# Patient Record
Sex: Female | Born: 1965 | Race: White | Hispanic: No | Marital: Married | Smoking: Current every day smoker
Health system: Southern US, Community
[De-identification: ages and names within clinical notes are randomized; demographics above are authoritative.]

## PROBLEM LIST (undated history)

## (undated) DIAGNOSIS — K219 Gastro-esophageal reflux disease without esophagitis: Secondary | ICD-10-CM

## (undated) DIAGNOSIS — Z8719 Personal history of other diseases of the digestive system: Secondary | ICD-10-CM

## (undated) DIAGNOSIS — E039 Hypothyroidism, unspecified: Secondary | ICD-10-CM

## (undated) DIAGNOSIS — I10 Essential (primary) hypertension: Secondary | ICD-10-CM

## (undated) DIAGNOSIS — I219 Acute myocardial infarction, unspecified: Secondary | ICD-10-CM

## (undated) DIAGNOSIS — Z87442 Personal history of urinary calculi: Secondary | ICD-10-CM

## (undated) DIAGNOSIS — E7800 Pure hypercholesterolemia, unspecified: Secondary | ICD-10-CM

## (undated) DIAGNOSIS — G709 Myoneural disorder, unspecified: Secondary | ICD-10-CM

## (undated) HISTORY — PX: CHOLECYSTECTOMY: SHX55

## (undated) HISTORY — PX: COLONOSCOPY WITH ESOPHAGOGASTRODUODENOSCOPY (EGD): SHX5779

## (undated) HISTORY — PX: OTHER SURGICAL HISTORY: SHX169

---

## 1999-10-02 ENCOUNTER — Emergency Department (HOSPITAL_COMMUNITY): Admission: EM | Admit: 1999-10-02 | Discharge: 1999-10-02 | Payer: Self-pay | Admitting: *Deleted

## 2000-10-30 ENCOUNTER — Emergency Department (HOSPITAL_COMMUNITY): Admission: EM | Admit: 2000-10-30 | Discharge: 2000-10-30 | Payer: Self-pay | Admitting: Emergency Medicine

## 2000-10-30 ENCOUNTER — Encounter: Payer: Self-pay | Admitting: Emergency Medicine

## 2002-03-11 ENCOUNTER — Emergency Department (HOSPITAL_COMMUNITY): Admission: EM | Admit: 2002-03-11 | Discharge: 2002-03-12 | Payer: Self-pay | Admitting: Emergency Medicine

## 2002-03-14 ENCOUNTER — Encounter: Payer: Self-pay | Admitting: General Surgery

## 2002-03-14 ENCOUNTER — Encounter: Admission: RE | Admit: 2002-03-14 | Discharge: 2002-03-14 | Payer: Self-pay | Admitting: General Surgery

## 2002-06-21 ENCOUNTER — Ambulatory Visit (HOSPITAL_BASED_OUTPATIENT_CLINIC_OR_DEPARTMENT_OTHER): Admission: RE | Admit: 2002-06-21 | Discharge: 2002-06-21 | Payer: Self-pay | Admitting: Neurology

## 2002-11-27 ENCOUNTER — Encounter: Payer: Self-pay | Admitting: *Deleted

## 2002-11-27 ENCOUNTER — Inpatient Hospital Stay (HOSPITAL_COMMUNITY): Admission: EM | Admit: 2002-11-27 | Discharge: 2002-11-29 | Payer: Self-pay | Admitting: *Deleted

## 2004-05-09 ENCOUNTER — Ambulatory Visit (HOSPITAL_COMMUNITY): Admission: RE | Admit: 2004-05-09 | Discharge: 2004-05-09 | Payer: Self-pay

## 2004-11-14 ENCOUNTER — Emergency Department (HOSPITAL_COMMUNITY): Admission: EM | Admit: 2004-11-14 | Discharge: 2004-11-15 | Payer: Self-pay | Admitting: Emergency Medicine

## 2007-04-20 ENCOUNTER — Emergency Department (HOSPITAL_COMMUNITY): Admission: EM | Admit: 2007-04-20 | Discharge: 2007-04-20 | Payer: Self-pay | Admitting: Emergency Medicine

## 2009-09-19 ENCOUNTER — Emergency Department (HOSPITAL_COMMUNITY): Admission: EM | Admit: 2009-09-19 | Discharge: 2009-09-20 | Payer: Self-pay | Admitting: Emergency Medicine

## 2010-01-23 ENCOUNTER — Emergency Department (HOSPITAL_COMMUNITY): Admission: EM | Admit: 2010-01-23 | Discharge: 2010-01-23 | Payer: Self-pay | Admitting: Emergency Medicine

## 2010-05-26 LAB — CBC
HCT: 36.1 % (ref 36.0–46.0)
MCH: 32.2 pg (ref 26.0–34.0)
MCHC: 34.1 g/dL (ref 30.0–36.0)
MCV: 94.5 fL (ref 78.0–100.0)
Platelets: 255 10*3/uL (ref 150–400)
RDW: 13.7 % (ref 11.5–15.5)
WBC: 12.9 10*3/uL — ABNORMAL HIGH (ref 4.0–10.5)

## 2010-05-26 LAB — DIFFERENTIAL
Basophils Relative: 1 % (ref 0–1)
Lymphocytes Relative: 30 % (ref 12–46)
Lymphs Abs: 3.9 10*3/uL (ref 0.7–4.0)
Monocytes Absolute: 0.5 10*3/uL (ref 0.1–1.0)
Monocytes Relative: 4 % (ref 3–12)
Neutro Abs: 8.1 10*3/uL — ABNORMAL HIGH (ref 1.7–7.7)
Neutrophils Relative %: 63 % (ref 43–77)

## 2010-05-26 LAB — POCT CARDIAC MARKERS
Myoglobin, poc: 46 ng/mL (ref 12–200)
Troponin i, poc: <0.05 ng/mL (ref 0.00–0.09)

## 2010-05-26 LAB — URINE MICROSCOPIC-ADD ON

## 2010-05-26 LAB — URINALYSIS, ROUTINE W REFLEX MICROSCOPIC
Glucose, UA: >1000 mg/dL — AB
Leukocytes, UA: NEGATIVE
Protein, ur: 30 mg/dL — AB
Specific Gravity, Urine: >1.03 — ABNORMAL HIGH (ref 1.005–1.030)

## 2010-05-26 LAB — COMPREHENSIVE METABOLIC PANEL
Albumin: 3.4 g/dL — ABNORMAL LOW (ref 3.5–5.2)
Alkaline Phosphatase: 69 U/L (ref 39–117)
BUN: 8 mg/dL (ref 6–23)
Calcium: 8.3 mg/dL — ABNORMAL LOW (ref 8.4–10.5)
Creatinine, Ser: 0.53 mg/dL (ref 0.4–1.2)
Glucose, Bld: 194 mg/dL — ABNORMAL HIGH (ref 70–99)
Total Protein: 6.8 g/dL (ref 6.0–8.3)

## 2010-07-26 NOTE — Consult Note (Signed)
NAMELOREAL, SCHUESSLER                          ACCOUNT NO.:  [id number]   MEDICAL RECORD NO.:  [id number]                   PATIENT TYPE:  INP   LOCATION:  A220                                 FACILITY:  [facility]   PHYSICIAN:  Maisie Fus C. Wall, M.D.                DATE OF BIRTH:  12/29/1965   DATE OF CONSULTATION:  11/28/2002  DATE OF DISCHARGE:                                   CONSULTATION   REASON FOR CONSULTATION:  We were asked by Dr. Josefine Class to evaluate [name], a 45 year old married white female with chest discomfort quite  worrisome for ischemia.   Yesterday while at rest she developed substernal chest pressure and  tightness going up into her left neck and down into her left arm.  She broke  out in a sweat and became nauseated.  Her EKGs have been normal here as have  her enzymes.  Unfortunately, she has multiple risk factors including heavy  tobacco use, family history with a father having MI at age 51, obesity,  unknown lipid status.  She has had no recurrent pain since admission.   PAST MEDICAL HISTORY:  1. She has a history of migraines.  2. She has had three cesarean sections - 1983, 1986, 1990.  3. She has a history of hypothyroidism on replacement therapy.  4. She has had a cholecystectomy in 1987.   MEDICATIONS PRIOR TO ADMISSION:  Synthroid 88 mcg daily.   ALLERGIES:  No known drug allergies.   SOCIAL HISTORY:  She lives with her husband.  She has a 23 pack-year smoking  history.  She does not drink.  She does not do any drugs.   REVIEW OF SYSTEMS:  Unremarkable.   PHYSICAL EXAMINATION:  VITAL SIGNS:  Blood pressure today is 111/63, pulse  56 and regular, respiratory rate 20, temperature is 97.4, O2 saturation 98%  on room air.  She weighs 213.3.  GENERAL:  She is in no acute distress.  Skin is warm and dry.  HEENT:  Unremarkable.  Carotid upstrokes are equal bilaterally without  bruits.  There is no JVD.  Thyroid is not enlarged.  HEART:  Reveals a  regular rate and rhythm with a soft systolic murmur along  the left sternal border.  LUNGS:  Clear.  SKIN:  Unremarkable.  ABDOMEN:  Soft, good bowel sounds.  She is obese making organomegaly  difficult to assess.  EXTREMITIES:  Reveal good pulses both dorsalis pedis and posterior tibial.  There is no edema.  NEUROLOGIC:  Intact.   LABORATORY DATA:  Chest x-ray is pending.   EKG shows sinus rhythm with a rate of 66.  She has some nonspecific ST  segment changes.   Cardiac enzymes x3 were negative.  Troponin is borderline high-normal.   ASSESSMENT AND PLAN:  1. Chest discomfort quite consistent with angina.  She has multiple cardiac     risk factors.  Although she is a young age, I feel reluctant to stress     her with this presentation.  Will plan on referring her for a left heart     catheterization and coronary angiogram.  She understands the indications,     risks, potential benefits, and agrees to proceed.  Will transfer her     tomorrow when bed and catheterization laboratory schedule are available.  2. Hypothyroidism on replacement therapy.  3. Tobacco use.  We have encouraged her to stop smoking.   Will also check a fasting lipid panel.                                               Thomas C. Wall, M.D.    TCW/MEDQ  D:  11/28/2002  T:  11/28/2002  Job:  [id number]   cc:   [place],  [place] [address] Stacie [place]

## 2010-07-26 NOTE — Discharge Summary (Signed)
NAMECARMELL, [name] NO.:  [id number]   MEDICAL RECORD NO.:  [id number]                   PATIENT TYPE:  INP   LOCATION:  4708                                 FACILITY:  [facility]   PHYSICIAN:  Pricilla Riffle, M.D.                 DATE OF BIRTH:  12/02/1965   DATE OF ADMISSION:  11/28/2002  DATE OF DISCHARGE:  11/29/2002                           DISCHARGE SUMMARY - REFERRING   PROCEDURE:  Cardiac catheterization September 21.   REASON FOR ADMISSION:  Please refer to dictated consultation note by Dr.  Daleen Squibb.   LABORATORY DATA:  (At [facility]) Normal CBC. INR 1.0. Normal electrolytes  and renal function. Glucose 158. Elevated total CPK 230 with normal MB  fraction, troponin I 0.04, 0.05. Lipid profile:  Total cholesterol 164,  triglycerides 305, HDL 25, LDL 78 (cholesterol/HDL ratio 6.6). TSH 0.964.   Chest x-ray [facility]):  Mild pulmonary edema.   HOSPITAL COURSE:  Following initial presentation to [facility] for  evaluation of chest pain, the patient was referred to Dr. Valera Castle for  consultation (see dictated note for full details). The patient presented  with no previous history of coronary artery disease, but symptoms were  worrisome for unstable angina pectoris. Therefore, recommendation was to  arrange transfer to [facility] for diagnostic coronary angiography. Initial  cardiac markers were negative.   Following transfer, the patient was maintained on medication regimen  consisting of Lovenox, beta blocker, and aspirin.   The following day, the patient underwent coronary angiography by Dr. Randa Evens (see report for full details) revealing normal coronary arteries and  a normal left ventricle.   Dr. Samule Ohm recommended aggressive primary prevention including smoking  cessation, weight loss, and management of diabetes mellitus.   MEDICATIONS AT DISCHARGE:  Synthroid 0.088 mg q.d.   DISCHARGE INSTRUCTIONS:  Refrain from  any heavy lifting, strenuous activity,  or driving for two days. Maintain low fat/cholesterol diet. Call the  cardiology office in  [place] [phone]) for any bleeding/swelling of the  groin.   The patient is instructed to arrange followup with her primary care  physician, Dr. Rosana Berger, in two weeks.   The patient will return to Dr. Valera Castle on an as needed basis.   DISCHARGE DIAGNOSES:  1. Noncardiac chest pain. Normal coronary angiogram September 21.  2. Multiple cardiac risk factors.     A. Mixed dyslipidemia.     B. Tobacco.     C.        Obesity.     D. Family history of premature coronary artery disease.  3. Treated hypothyroidism.      Gene Serpe, P.A. [facility]                      Pricilla Riffle, M.D.    GS/MEDQ  D:  11/29/2002  T:  11/30/2002  Job:  [id number]   cc:   Joycelyn Rua, M.D.  [address] [place]  [place] [address]  Fax: [phone]

## 2010-07-26 NOTE — Discharge Summary (Signed)
   NAMEDEANZA, UPPERMAN                          ACCOUNT NO.:  [id number]   MEDICAL RECORD NO.:  [id number]                   PATIENT TYPE:  INP   LOCATION:  4708                                 FACILITY:  [facility]   PHYSICIAN:  Hanley Hays. Dechurch, M.D.           DATE OF BIRTH:  [birth year]   DATE OF ADMISSION:  11/27/2002  DATE OF DISCHARGE:  11/28/2002                                 DISCHARGE SUMMARY   DIAGNOSES:  1. Chest pain.  2. Hypothyroidism.  3. Tobacco abuse.   DISPOSITION:  The patient transferred to [facility] for left heart  catheterization.   HOSPITAL COURSE:  A 45 year old Caucasian female with presentation of chest  pain with diaphoresis and nausea concerning for angina with a history of  tobacco abuse, family history of father having an MI at age 83, with lipid  status unknown at that time, who was pain free after receiving  nitroglycerin.  She was seen in consultation with cardiology, who felt that  her symptoms were worrisome to the point that left heart catheterization was  indicated and arrangements were made for transfer to [facility].  She was discharged on September 20, in stable condition.     ___________________________________________                                         Hanley Hays Josefine Class, M.D.   FED/MEDQ  D:  12/28/2002  T:  12/28/2002  Job:  [id number]

## 2010-07-26 NOTE — Cardiovascular Report (Signed)
   [name], POSTER NO.:  [id number]   MEDICAL RECORD NO.:  [id number]                   PATIENT TYPE:  INP   LOCATION:  4708                                 FACILITY:  [facility]   PHYSICIAN:  Salvadore Farber, M.D.             DATE OF BIRTH:  June 27, 1965   DATE OF PROCEDURE:  11/29/2002  DATE OF DISCHARGE:                              CARDIAC CATHETERIZATION   PROCEDURE:  Left heart catheterization, left ventriculography, coronary  angiography.   INDICATIONS:  Ms. Dehaan is a 45 year old lady with ongoing tobacco abuse  who presents with chest discomfort.  Though there is no documented history  of diabetes mellitus, she has an elevated glucose on admission.  She also  has a family history remarkable for myocardial infarction in her father at  35 years.  Due to these risk factors, she was referred directly for  diagnostic angiography.   PROCEDURAL TECHNIQUE:  Informed consent was obtained.  Under 1% lidocaine  local anesthesia a 6-French sheath was placed in the right femoral artery  using the modified Seldinger technique.  Diagnostic angiography and  ventriculography were performed using JL4, JR4, and pigtail catheters.  The  patient tolerated the procedure well.  Sheath was removed on the table.  She  was transferred to the holding room in stable condition.   COMPLICATIONS:  None.   FINDINGS:  1. Left main:  Angiographically normal.  2. LAD:  Moderate sized vessel giving rise to a single diagonal branch.  It     is angiographically normal.  3. Circumflex:  Moderate sized vessel giving rise to two obtuse marginals.     It is angiographically normal.  4. RCA:  Moderate sized, dominant vessel which is angiographically normal.  5. LV 157/15/23.  EF 60% without regional wall motion abnormality.  6. No aortic stenosis.  7. Unable to assess mitral regurgitation due to ventricular ectopy during     ventriculography.   IMPRESSION/RECOMMENDATIONS:  1. Angiographically normal coronary arteries.  2. Normal left ventricular size and systolic function.  3. No aortic stenosis.   The patient has normal cardiac catheterization.  Suspect noncardiac etiology  to her chest discomfort.  Nonetheless, primary prevention will be critical  for prevention of development of disease in this young lady.  Smoking  cessation, weight loss, and potentially therapy for her newly diagnosed  diabetes will be important.                                               Salvadore Farber, M.D.    WED/MEDQ  D:  11/29/2002  T:  11/29/2002  Job:  [id number]   cc:   Burnadette Pop, M.D.   Thomas C. Wall, M.D.

## 2011-03-11 HISTORY — PX: ESOPHAGOGASTRODUODENOSCOPY: SHX1529

## 2011-03-11 HISTORY — PX: COLONOSCOPY: SHX174

## 2011-03-14 ENCOUNTER — Encounter: Payer: Self-pay | Admitting: Emergency Medicine

## 2011-03-14 DIAGNOSIS — F17200 Nicotine dependence, unspecified, uncomplicated: Secondary | ICD-10-CM | POA: Insufficient documentation

## 2011-03-14 DIAGNOSIS — R141 Gas pain: Secondary | ICD-10-CM | POA: Insufficient documentation

## 2011-03-14 DIAGNOSIS — R10816 Epigastric abdominal tenderness: Secondary | ICD-10-CM | POA: Insufficient documentation

## 2011-03-14 DIAGNOSIS — R05 Cough: Secondary | ICD-10-CM | POA: Insufficient documentation

## 2011-03-14 DIAGNOSIS — E119 Type 2 diabetes mellitus without complications: Secondary | ICD-10-CM | POA: Insufficient documentation

## 2011-03-14 DIAGNOSIS — I10 Essential (primary) hypertension: Secondary | ICD-10-CM | POA: Insufficient documentation

## 2011-03-14 DIAGNOSIS — R142 Eructation: Secondary | ICD-10-CM | POA: Insufficient documentation

## 2011-03-14 DIAGNOSIS — R059 Cough, unspecified: Secondary | ICD-10-CM | POA: Insufficient documentation

## 2011-03-14 DIAGNOSIS — R300 Dysuria: Secondary | ICD-10-CM | POA: Insufficient documentation

## 2011-03-14 DIAGNOSIS — R1012 Left upper quadrant pain: Secondary | ICD-10-CM | POA: Insufficient documentation

## 2011-03-14 DIAGNOSIS — R143 Flatulence: Secondary | ICD-10-CM | POA: Insufficient documentation

## 2011-03-14 DIAGNOSIS — E7800 Pure hypercholesterolemia, unspecified: Secondary | ICD-10-CM | POA: Insufficient documentation

## 2011-03-14 DIAGNOSIS — J3489 Other specified disorders of nose and nasal sinuses: Secondary | ICD-10-CM | POA: Insufficient documentation

## 2011-03-14 DIAGNOSIS — R10812 Left upper quadrant abdominal tenderness: Secondary | ICD-10-CM | POA: Insufficient documentation

## 2011-03-15 ENCOUNTER — Emergency Department (HOSPITAL_COMMUNITY): Payer: BC Managed Care – PPO

## 2011-03-15 ENCOUNTER — Emergency Department (HOSPITAL_COMMUNITY)
Admission: EM | Admit: 2011-03-15 | Discharge: 2011-03-15 | Disposition: A | Discharge status: Home / Self Care | Payer: BC Managed Care – PPO | Attending: Emergency Medicine | Admitting: Emergency Medicine

## 2011-03-15 DIAGNOSIS — R0789 Other chest pain: Secondary | ICD-10-CM

## 2011-03-15 HISTORY — DX: Pure hypercholesterolemia, unspecified: E78.00

## 2011-03-15 HISTORY — DX: Essential (primary) hypertension: I10

## 2011-03-15 LAB — DIFFERENTIAL
Basophils Absolute: 0 10*3/uL (ref 0.0–0.1)
Lymphocytes Relative: 32 % (ref 12–46)
Neutro Abs: 7.2 10*3/uL (ref 1.7–7.7)

## 2011-03-15 LAB — COMPREHENSIVE METABOLIC PANEL
ALT: 14 U/L (ref 0–35)
AST: 12 U/L (ref 0–37)
Alkaline Phosphatase: 61 U/L (ref 39–117)
CO2: 23 mEq/L (ref 19–32)
Chloride: 102 mEq/L (ref 96–112)
GFR calc non Af Amer: 88 mL/min — ABNORMAL LOW (ref >90)
Sodium: 133 mEq/L — ABNORMAL LOW (ref 135–145)
Total Bilirubin: 0.2 mg/dL — ABNORMAL LOW (ref 0.3–1.2)

## 2011-03-15 LAB — URINALYSIS, ROUTINE W REFLEX MICROSCOPIC
Bilirubin Urine: NEGATIVE
Specific Gravity, Urine: >1.03 — ABNORMAL HIGH (ref 1.005–1.030)
Urobilinogen, UA: 0.2 mg/dL (ref 0.0–1.0)

## 2011-03-15 LAB — CBC
HCT: 35.8 % — ABNORMAL LOW (ref 36.0–46.0)
Platelets: 241 10*3/uL (ref 150–400)
RBC: 3.97 MIL/uL (ref 3.87–5.11)
RDW: 14 % (ref 11.5–15.5)
WBC: 11.7 10*3/uL — ABNORMAL HIGH (ref 4.0–10.5)

## 2011-03-15 LAB — URINE MICROSCOPIC-ADD ON

## 2011-03-15 MED ORDER — HYDROCODONE-ACETAMINOPHEN 7.5-500 MG/15ML PO SOLN: 15.0000 mL | Freq: Four times a day (QID) | ORAL | Status: AC | PRN

## 2011-03-15 MED ORDER — SODIUM CHLORIDE 0.9 % IV SOLN
Freq: Once | INTRAVENOUS | Status: AC
  Administered 2011-03-15: 01:00:00 via INTRAVENOUS

## 2011-03-15 MED ORDER — HYDROCODONE-ACETAMINOPHEN 7.5-500 MG/15ML PO SOLN
10.0000 mL | Freq: Once | ORAL | Status: AC
  Administered 2011-03-15: 10 mL via ORAL
  Filled 2011-03-15: qty 15

## 2011-03-15 NOTE — ED Notes (Signed)
Pt alert & oriented x4, stable gait. Pt given discharge instructions, paperwork & prescription(s), pt verbalized understanding. Pt left department w/ no further questions.  

## 2011-03-15 NOTE — Discharge Instructions (Signed)
Chest Wall Pain Chest wall pain is pain in or around the bones and muscles of your chest. This may occur:   On its own (spontaneously).   After a viral illness such as the flu.   Through injur.   From coughing.   Minor exercise.  It may take up to 6 weeks to get better; longer if you must stay physically active in your work and activities. HOME CARE INSTRUCTIONS   Avoid over-tiring physical activity. Try not to strain or perform activities which cause pain. This would include any activities using chest, belly (abdominal) and side muscles, especially if heavy weights are used.   Use ice on the painful area for 15 to 20 minutes per hour while awake for the first 2 days. Place the ice in a plastic bag and place a towel between the bag of ice and your skin.   Only take over-the-counter or prescription medicines for pain, discomfort, or fever as directed by your caregiver.  SEEK IMMEDIATE MEDICAL CARE IF:   Your pain increases or you are very uncomfortable.   An oral temperature above 102 F (38.9 C)develops.   Your chest pains become worse.   You develop new, unexplained problems (symptoms).   You develop nausea, vomiting, sweating or feel light headed.   You develop a cough which produces phlegm (sputum) or you cough up blood.  MAKE SURE YOU:   Understand these instructions.   Will watch your condition.   Will get help right away if you are not doing well or get worse.  Document Released: 02/24/2005 Document Revised: 09/09/2010 Document Reviewed: 10/13/2007 Exit[employer] Patient Information 2012 [employer]. 

## 2011-03-15 NOTE — ED Provider Notes (Signed)
History     CSN: [id number]  Arrival date & time 03/14/11  2342   First MD Initiated Contact with Patient 03/15/11 0008      Chief Complaint  Patient presents with  . Abdominal Pain  . Cough  . Nasal Congestion    (Consider location/radiation/quality/duration/timing/severity/associated sxs/prior treatment) Patient is a 46 y.o. female presenting with abdominal pain and cough. The history is provided by the patient.  Abdominal Pain The primary symptoms of the illness include abdominal pain.  Cough   patient here with 2 weeks of cough congestion symptoms. Tonight however she noticed some left upper quadrant pain as well as abdominal distention. Some associated nausea denies any vomiting or fever. Had normal bowel abdomen today. Has noted some increase in flatus. Denies any diarrhea. Nothing makes her symptoms better or worse. No medications used prior to arrival. She does note some dysuria as well. Denies any colicky symptoms  Past Medical History  Diagnosis Date  . Diabetes mellitus   . Hypertension   . High cholesterol     Past Surgical History  Procedure Date  . Cholecystectomy   . Cesarean section     No family history on file.  History  Substance Use Topics  . Smoking status: Current Everyday Smoker -- 1.0 packs/day    Types: Cigarettes  . Smokeless tobacco: Not on file  . Alcohol Use: Yes     occ    OB History    Grav Para Term Preterm Abortions TAB SAB Ect Mult Living                  Review of Systems  Respiratory: Positive for cough.   Gastrointestinal: Positive for abdominal pain.  All other systems reviewed and are negative.    Allergies  Review of patient's allergies indicates no known allergies.  Home Medications   Current Outpatient Rx  Name Route Sig Dispense Refill  . LISINOPRIL-HYDROCHLOROTHIAZIDE 20-12.5 MG PO TABS Oral Take 2 tablets by mouth daily.      . NORETHINDRONE ACETATE 5 MG PO TABS Oral Take 10 mg by mouth daily.      Marland Kitchen  SAXAGLIPTIN-METFORMIN ER 2.07-998 MG PO TB24 Oral Take 2 tablets by mouth daily with supper.        BP 144/75  Pulse 69  Temp(Src) 98.4 F (36.9 C) (Oral)  Resp 18  Ht 4\' 11"  (1.499 m)  Wt 167 lb (75.751 kg)  BMI 33.73 kg/m2  SpO2 99%  LMP 03/10/2011  Physical Exam  Nursing note and vitals reviewed. Constitutional: She is oriented to person, place, and time. She appears well-developed and well-nourished.  Non-toxic appearance. No distress.  HENT:  Head: Normocephalic and atraumatic.  Eyes: Conjunctivae, EOM and lids are normal. Pupils are equal, round, and reactive to light.  Neck: Normal range of motion. Neck supple. No tracheal deviation present. No mass present.  Cardiovascular: Normal rate, regular rhythm and normal heart sounds.  Exam reveals no gallop.   No murmur heard. Pulmonary/Chest: Effort normal and breath sounds normal. No stridor. No respiratory distress. She has no decreased breath sounds. She has no wheezes. She has no rhonchi. She has no rales.  Abdominal: Soft. Normal appearance and bowel sounds are normal. She exhibits no distension. There is tenderness in the epigastric area and left upper quadrant. There is no rigidity, no rebound, no guarding and no CVA tenderness.  Musculoskeletal: Normal range of motion. She exhibits no edema and no tenderness.  Neurological: She is alert and oriented  to person, place, and time. She has normal strength. No cranial nerve deficit or sensory deficit. GCS eye subscore is 4. GCS verbal subscore is 5. GCS motor subscore is 6.  Skin: Skin is warm and dry. No abrasion and no rash noted.  Psychiatric: She has a normal mood and affect. Her speech is normal and behavior is normal.    ED Course  Procedures (including critical care time)   Labs Reviewed  CBC  DIFFERENTIAL  COMPREHENSIVE METABOLIC PANEL  URINALYSIS, ROUTINE W REFLEX MICROSCOPIC  URINE CULTURE  LIPASE, BLOOD   No results found.   No diagnosis found.    MDM    Patient given medication for cough as well as better at this time. Patient notes pain in her left costal margin which when reexamined his current outpatient. Suspect patient's chest wall pain. She has been coughing for 2 weeks and notes that the pain is worse with movement we'll prescribe medication for this        Toy Baker, MD 03/15/11 [id number]

## 2011-03-16 LAB — URINE CULTURE: Culture: NO GROWTH

## 2011-09-02 ENCOUNTER — Other Ambulatory Visit (HOSPITAL_BASED_OUTPATIENT_CLINIC_OR_DEPARTMENT_OTHER): Payer: Self-pay | Admitting: Family Medicine

## 2011-09-02 DIAGNOSIS — R1012 Left upper quadrant pain: Secondary | ICD-10-CM

## 2011-09-03 ENCOUNTER — Ambulatory Visit (HOSPITAL_BASED_OUTPATIENT_CLINIC_OR_DEPARTMENT_OTHER)
Admission: RE | Admit: 2011-09-03 | Discharge: 2011-09-03 | Disposition: A | Discharge status: Home / Self Care | Payer: BC Managed Care – PPO | Source: Ambulatory Visit | Attending: Family Medicine | Admitting: Family Medicine

## 2011-09-03 ENCOUNTER — Ambulatory Visit (HOSPITAL_BASED_OUTPATIENT_CLINIC_OR_DEPARTMENT_OTHER): Payer: BC Managed Care – PPO

## 2011-09-03 DIAGNOSIS — I700 Atherosclerosis of aorta: Secondary | ICD-10-CM | POA: Insufficient documentation

## 2011-09-03 DIAGNOSIS — I708 Atherosclerosis of other arteries: Secondary | ICD-10-CM | POA: Insufficient documentation

## 2011-09-03 DIAGNOSIS — R109 Unspecified abdominal pain: Secondary | ICD-10-CM | POA: Insufficient documentation

## 2011-09-03 DIAGNOSIS — R1012 Left upper quadrant pain: Secondary | ICD-10-CM

## 2011-09-03 MED ORDER — IOHEXOL 300 MG/ML  SOLN
100.0000 mL | Freq: Once | INTRAMUSCULAR | Status: AC | PRN
  Administered 2011-09-03: 100 mL via INTRAVENOUS

## 2011-11-18 ENCOUNTER — Encounter (HOSPITAL_COMMUNITY): Payer: Self-pay | Admitting: *Deleted

## 2011-11-18 ENCOUNTER — Ambulatory Visit (HOSPITAL_COMMUNITY)
Admission: RE | Admit: 2011-11-18 | Discharge: 2011-11-18 | Disposition: A | Discharge status: Home / Self Care | Payer: BC Managed Care – PPO | Source: Ambulatory Visit | Attending: Gastroenterology | Admitting: Gastroenterology

## 2011-11-18 ENCOUNTER — Encounter (HOSPITAL_COMMUNITY): Payer: Self-pay | Admitting: Anesthesiology

## 2011-11-18 ENCOUNTER — Ambulatory Visit (HOSPITAL_COMMUNITY): Payer: BC Managed Care – PPO | Admitting: Anesthesiology

## 2011-11-18 ENCOUNTER — Encounter (HOSPITAL_COMMUNITY)
Admission: RE | Disposition: A | Discharge status: Home / Self Care | Payer: Self-pay | Source: Ambulatory Visit | Attending: Gastroenterology

## 2011-11-18 DIAGNOSIS — K219 Gastro-esophageal reflux disease without esophagitis: Secondary | ICD-10-CM | POA: Insufficient documentation

## 2011-11-18 DIAGNOSIS — K319 Disease of stomach and duodenum, unspecified: Secondary | ICD-10-CM | POA: Insufficient documentation

## 2011-11-18 DIAGNOSIS — E119 Type 2 diabetes mellitus without complications: Secondary | ICD-10-CM | POA: Insufficient documentation

## 2011-11-18 DIAGNOSIS — K449 Diaphragmatic hernia without obstruction or gangrene: Secondary | ICD-10-CM | POA: Insufficient documentation

## 2011-11-18 DIAGNOSIS — I10 Essential (primary) hypertension: Secondary | ICD-10-CM | POA: Insufficient documentation

## 2011-11-18 DIAGNOSIS — R1033 Periumbilical pain: Secondary | ICD-10-CM | POA: Insufficient documentation

## 2011-11-18 HISTORY — DX: Myoneural disorder, unspecified: G70.9

## 2011-11-18 HISTORY — DX: Personal history of other diseases of the digestive system: Z87.19

## 2011-11-18 HISTORY — DX: Gastro-esophageal reflux disease without esophagitis: K21.9

## 2011-11-18 HISTORY — PX: EUS: SHX5427

## 2011-11-18 LAB — GLUCOSE, CAPILLARY: Glucose-Capillary: 163 mg/dL — ABNORMAL HIGH (ref 70–99)

## 2011-11-18 SURGERY — UPPER ENDOSCOPIC ULTRASOUND (EUS) RADIAL
Anesthesia: Monitor Anesthesia Care

## 2011-11-18 MED ORDER — PROPOFOL 10 MG/ML IV EMUL
INTRAVENOUS | Status: DC | PRN
  Administered 2011-11-18: 80 ug/kg/min via INTRAVENOUS

## 2011-11-18 MED ORDER — SODIUM CHLORIDE 0.9 % IV SOLN: INTRAVENOUS | Status: DC

## 2011-11-18 MED ORDER — LACTATED RINGERS IV SOLN
INTRAVENOUS | Status: DC
  Administered 2011-11-18: 1000 mL via INTRAVENOUS

## 2011-11-18 MED ORDER — KETAMINE HCL 50 MG/ML IJ SOLN
INTRAMUSCULAR | Status: DC | PRN
  Administered 2011-11-18: 50 mg via INTRAMUSCULAR

## 2011-11-18 MED ORDER — MIDAZOLAM HCL 5 MG/5ML IJ SOLN
INTRAMUSCULAR | Status: DC | PRN
  Administered 2011-11-18: 2 mg via INTRAVENOUS

## 2011-11-18 MED ORDER — BUTAMBEN-TETRACAINE-BENZOCAINE 2-2-14 % EX AERO
INHALATION_SPRAY | CUTANEOUS | Status: DC | PRN
  Administered 2011-11-18: 2 via TOPICAL

## 2011-11-18 MED ORDER — FENTANYL CITRATE 0.05 MG/ML IJ SOLN
INTRAMUSCULAR | Status: DC | PRN
  Administered 2011-11-18: 100 ug via INTRAVENOUS

## 2011-11-18 NOTE — Anesthesia Postprocedure Evaluation (Signed)
  Anesthesia Post-op Note  Patient: [name]  Procedure(s) Performed: Procedure(s) (LRB): UPPER ENDOSCOPIC ULTRASOUND (EUS) RADIAL (N/A)  Patient Location: PACU  Anesthesia Type: MAC  Level of Consciousness: awake and alert   Airway and Oxygen Therapy: Patient Spontanous Breathing  Post-op Pain: mild  Post-op Assessment: Post-op Vital signs reviewed, Patient's Cardiovascular Status Stable, Respiratory Function Stable, Patent Airway and No signs of Nausea or vomiting  Post-op Vital Signs: stable  Complications: No apparent anesthesia complications

## 2011-11-18 NOTE — Anesthesia Preprocedure Evaluation (Addendum)
Anesthesia Evaluation  Patient identified by MRN, date of birth, ID band Patient awake    Reviewed: Allergy & Precautions, H&P , NPO status , Patient's Chart, lab work & pertinent test results  Airway Mallampati: II TM Distance: >3 FB Neck ROM: full    Dental No notable dental hx. (+) Chipped,    Pulmonary neg pulmonary ROS,  breath sounds clear to auscultation  Pulmonary exam normal       Cardiovascular Exercise Tolerance: Good hypertension, Pt. on medications negative cardio ROS  Rhythm:regular Rate:Normal     Neuro/Psych neuropathy negative neurological ROS  negative psych ROS   GI/Hepatic negative GI ROS, Neg liver ROS, hiatal hernia, GERD-  Medicated and Controlled,  Endo/Other  negative endocrine ROSdiabetes, Well Controlled, Type 2, Oral Hypoglycemic Agents  Renal/GU negative Renal ROS  negative genitourinary   Musculoskeletal negative musculoskeletal ROS (+)   Abdominal   Peds negative pediatric ROS (+)  Hematology negative hematology ROS (+)   Anesthesia Other Findings   Reproductive/Obstetrics negative OB ROS                          Anesthesia Physical Anesthesia Plan  ASA: III  Anesthesia Plan: MAC   Post-op Pain Management:    Induction:   Airway Management Planned:   Additional Equipment:   Intra-op Plan:   Post-operative Plan:   Informed Consent: I have reviewed the patients History and Physical, chart, labs and discussed the procedure including the risks, benefits and alternatives for the proposed anesthesia with the patient or authorized representative who has indicated his/her understanding and acceptance.   Dental Advisory Given  Plan Discussed with: Surgeon  Anesthesia Plan Comments:        Anesthesia Quick Evaluation

## 2011-11-18 NOTE — Op Note (Addendum)
[facility] [address] [place], [address]   ENDOSCOPIC ULTRASOUND PROCEDURE REPORT  PATIENT: Mckell, Sciascia  MR#: [id number] BIRTHDATE: 01/23/1966  GENDER: Female ENDOSCOPIST: Willis Modena, MD REFERRED BY:  Carman Ching, M.D. PROCEDURE DATE:  11/18/2011 PROCEDURE:   Upper EUS ASA CLASS:      Class III INDICATIONS:   1.  periumbilical abdominal pain.   2.  gastric nodule. MEDICATIONS: MAC sedation, administered by CRNA and Cetacaine spray x 2  DESCRIPTION OF PROCEDURE:   After the risks benefits and alternatives of the procedure were  explained, informed consent was obtained. The patient was then placed in the left, lateral, decubitus postion and IV sedation was administered. Throughout the procedure, the patients blood pressure, pulse and oxygen saturations were monitored continuously.  Under direct visualization, the     endoscope was introduced through the mouth and advanced to the second portion of the duodenum .  Water was used as necessary to provide an acoustic interface.  Upon completion of the imaging, water was removed and the patient was sent to the recovery room in satisfactory condition.     FINDINGS:  IMPRESSION:  RECOMMENDATIONS:   _______________________________ Rosalie DoctorWillis Modena, MD 11/18/2011 11:23 AM   CC:

## 2011-11-18 NOTE — H&P (Signed)
Patient interval history reviewed.  Patient examined again.  There has been no change from documented H/P dated 11/13/11 (scanned into chart from our office) except as documented above.  Assessment:  1.  Gastric nodule, submucosal-appearing.  Plan:  1.  Endoscopic ultrasound with possible fine needle aspiration (FNA) biopsies. 2.  Risks (bleeding, infection, bowel perforation that could require surgery, sedation-related changes in cardiopulmonary systems), benefits (identification and possible treatment of source of symptoms, exclusion of certain causes of symptoms), and alternatives (watchful waiting, radiographic imaging studies, empiric medical treatment) of upper endoscopy with ultrasound and possible biopsies (EUS +/- FNA) were explained to patient in detail and she wishes to proceed.

## 2011-11-18 NOTE — Transfer of Care (Signed)
Immediate Anesthesia Transfer of Care Note  Patient: [name]  Procedure(s) Performed: Procedure(s) (LRB) with comments: UPPER ENDOSCOPIC ULTRASOUND (EUS) RADIAL (N/A) - Christina/WM  Patient Location: PACU  Anesthesia Type: MAC  Level of Consciousness: sedated  Airway & Oxygen Therapy: Patient Spontanous Breathing and Patient connected to nasal cannula oxygen  Post-op Assessment: Report given to PACU RN and Post -op Vital signs reviewed and stable  Post vital signs: Reviewed and stable  Complications: No apparent anesthesia complications

## 2011-11-19 ENCOUNTER — Encounter (HOSPITAL_COMMUNITY): Payer: Self-pay | Admitting: Gastroenterology

## 2013-03-10 DIAGNOSIS — I219 Acute myocardial infarction, unspecified: Secondary | ICD-10-CM

## 2013-03-10 HISTORY — PX: CORONARY ANGIOPLASTY WITH STENT PLACEMENT: SHX49

## 2013-03-10 HISTORY — DX: Acute myocardial infarction, unspecified: I21.9

## 2013-12-23 ENCOUNTER — Inpatient Hospital Stay: Admission: AD | Admit: 2013-12-23 | Payer: Self-pay | Source: Other Acute Inpatient Hospital | Admitting: Cardiology

## 2014-06-09 HISTORY — PX: COLONOSCOPY: SHX174

## 2014-06-27 ENCOUNTER — Emergency Department (HOSPITAL_COMMUNITY): Payer: BLUE CROSS/BLUE SHIELD

## 2014-06-27 ENCOUNTER — Encounter (HOSPITAL_COMMUNITY): Payer: Self-pay | Admitting: Emergency Medicine

## 2014-06-27 ENCOUNTER — Emergency Department (HOSPITAL_COMMUNITY)
Admission: EM | Admit: 2014-06-27 | Discharge: 2014-06-27 | Disposition: A | Discharge status: Home / Self Care | Payer: BLUE CROSS/BLUE SHIELD | Attending: Emergency Medicine | Admitting: Emergency Medicine

## 2014-06-27 DIAGNOSIS — Z79899 Other long term (current) drug therapy: Secondary | ICD-10-CM | POA: Insufficient documentation

## 2014-06-27 DIAGNOSIS — K219 Gastro-esophageal reflux disease without esophagitis: Secondary | ICD-10-CM | POA: Insufficient documentation

## 2014-06-27 DIAGNOSIS — Z794 Long term (current) use of insulin: Secondary | ICD-10-CM | POA: Diagnosis not present

## 2014-06-27 DIAGNOSIS — Z8669 Personal history of other diseases of the nervous system and sense organs: Secondary | ICD-10-CM | POA: Diagnosis not present

## 2014-06-27 DIAGNOSIS — E780 Pure hypercholesterolemia: Secondary | ICD-10-CM | POA: Diagnosis not present

## 2014-06-27 DIAGNOSIS — Z720 Tobacco use: Secondary | ICD-10-CM | POA: Insufficient documentation

## 2014-06-27 DIAGNOSIS — I252 Old myocardial infarction: Secondary | ICD-10-CM | POA: Insufficient documentation

## 2014-06-27 DIAGNOSIS — I10 Essential (primary) hypertension: Secondary | ICD-10-CM | POA: Diagnosis not present

## 2014-06-27 DIAGNOSIS — R079 Chest pain, unspecified: Secondary | ICD-10-CM | POA: Insufficient documentation

## 2014-06-27 DIAGNOSIS — E119 Type 2 diabetes mellitus without complications: Secondary | ICD-10-CM | POA: Diagnosis not present

## 2014-06-27 HISTORY — DX: Acute myocardial infarction, unspecified: I21.9

## 2014-06-27 LAB — CBC
HCT: 35.9 % — ABNORMAL LOW (ref 36.0–46.0)
Hemoglobin: 11.8 g/dL — ABNORMAL LOW (ref 12.0–15.0)
MCH: 29.6 pg (ref 26.0–34.0)
MCHC: 32.9 g/dL (ref 30.0–36.0)
MCV: 90.2 fL (ref 78.0–100.0)
Platelets: 215 10*3/uL (ref 150–400)
RBC: 3.98 MIL/uL (ref 3.87–5.11)
RDW: 14.4 % (ref 11.5–15.5)
WBC: 9.5 10*3/uL (ref 4.0–10.5)

## 2014-06-27 LAB — BASIC METABOLIC PANEL
Anion gap: 8 (ref 5–15)
BUN: 20 mg/dL (ref 6–23)
CO2: 24 mmol/L (ref 19–32)
Calcium: 8.6 mg/dL (ref 8.4–10.5)
Chloride: 106 mmol/L (ref 96–112)
Creatinine, Ser: 0.88 mg/dL (ref 0.50–1.10)
GFR calc Af Amer: 89 mL/min — ABNORMAL LOW (ref >90)
GFR calc non Af Amer: 76 mL/min — ABNORMAL LOW (ref >90)
Glucose, Bld: 165 mg/dL — ABNORMAL HIGH (ref 70–99)
Potassium: 4.2 mmol/L (ref 3.5–5.1)
Sodium: 138 mmol/L (ref 135–145)

## 2014-06-27 LAB — TROPONIN I: Troponin I: <0.03 ng/mL (ref <0.031)

## 2014-06-27 NOTE — ED Notes (Signed)
Pt reports intermittent chest pain x3 days. Pt reports pain is worse with a deep breath/movement. nad noted.

## 2014-06-27 NOTE — ED Provider Notes (Signed)
CSN: [id number]     Arrival date & time 06/27/14  1426 History   First MD Initiated Contact with Patient 06/27/14 1455     Chief Complaint  Patient presents with  . Chest Pain     (Consider location/radiation/quality/duration/timing/severity/associated sxs/prior Treatment) HPI   49 year old female with chest pain. Symptom onset about 3 days ago. Initially intermittent, but now fairly constant over the past 24 hours. Sharp pain in the center of her chest which is worse with deep inspiration. Radiates around underneath her left breast and into her back. Worse with certain movements as well. No shortness of breath. No cough. No unusual leg pain or swelling. Patient has history of CAD. She states the current symptoms feel different than when she had her previous MI. No fevers or chills. No dizziness or lightheadedness.  Past Medical History  Diagnosis Date  . Diabetes mellitus   . Hypertension   . High cholesterol   . GERD (gastroesophageal reflux disease)   . H/O hiatal hernia   . Neuromuscular disorder     neuropathy; carpal tunnel  . MI (myocardial infarction)    Past Surgical History  Procedure Laterality Date  . Cholecystectomy    . Cesarean section    . Eus  11/18/2011    Procedure: UPPER ENDOSCOPIC ULTRASOUND (EUS) RADIAL;  Surgeon: Willis Modena, MD;  Location: [facility];  Service: Endoscopy;  Laterality: N/A;  Christina/WM  . Coronary angioplasty with stent placement     History reviewed. No pertinent family history. History  Substance Use Topics  . Smoking status: Current Every Day Smoker -- 1.00 packs/day    Types: Cigarettes  . Smokeless tobacco: Not on file  . Alcohol Use: No     Comment: occ   OB History    No data available     Review of Systems  All systems reviewed and negative, other than as noted in HPI.   Allergies  Review of patient's allergies indicates no known allergies.  Home Medications   Prior to Admission medications   Medication Sig  Start Date End Date Taking? Authorizing Provider  DULoxetine (CYMBALTA) 60 MG capsule Take 60 mg by mouth daily.    Historical Provider, MD  insulin detemir (LEVEMIR) 100 UNIT/ML injection Inject 40 Units into the skin daily.    Historical Provider, MD  Linaclotide Karlene Einstein) 145 MCG CAPS Take 145 mcg by mouth daily.    Historical Provider, MD  lisinopril-hydrochlorothiazide (PRINZIDE,ZESTORETIC) 20-12.5 MG per tablet Take 2 tablets by mouth daily.      Historical Provider, MD  norethindrone (AYGESTIN) 5 MG tablet Take 10 mg by mouth daily.      Historical Provider, MD  omeprazole (PRILOSEC) 20 MG capsule Take 20 mg by mouth daily.    Historical Provider, MD  oxyCODONE-acetaminophen (PERCOCET/ROXICET) 5-325 MG per tablet Take 1 tablet by mouth every 4 (four) hours as needed.    Historical Provider, MD  pravastatin (PRAVACHOL) 40 MG tablet Take 40 mg by mouth daily.    Historical Provider, MD  promethazine (PHENERGAN) 25 MG tablet Take 25 mg by mouth every 6 (six) hours as needed.    Historical Provider, MD  Saxagliptin-Metformin (KOMBIGLYZE XR) 2.07-998 MG TB24 Take 2 tablets by mouth daily with supper.      Historical Provider, MD   BP 124/67 mmHg  Pulse 71  Temp(Src) 98.2 F (36.8 C) (Oral)  Resp 18  Ht 4\' 11"  (1.499 m)  Wt 180 lb (81.647 kg)  BMI 36.34 kg/m2  SpO2 99%  LMP 06/20/2014 Physical Exam  Constitutional: She appears well-developed and well-nourished. No distress.  HENT:  Head: Normocephalic and atraumatic.  Eyes: Conjunctivae are normal. Right eye exhibits no discharge. Left eye exhibits no discharge.  Neck: Neck supple.  Cardiovascular: Normal rate, regular rhythm and normal heart sounds.  Exam reveals no gallop and no friction rub.   No murmur heard. Pulmonary/Chest: Effort normal and breath sounds normal. No respiratory distress. She exhibits tenderness.  Tenderness to palpation over the mid to lower sternum. No overlying skin changes.  Abdominal: Soft. She exhibits no  distension. There is no tenderness.  Musculoskeletal: She exhibits no edema or tenderness.  Lower extremities symmetric as compared to each other. No calf tenderness. Negative Homan's. No palpable cords.   Neurological: She is alert.  Skin: Skin is warm and dry.  Psychiatric: She has a normal mood and affect. Her behavior is normal. Thought content normal.  Nursing note and vitals reviewed.   ED Course  Procedures (including critical care time) Labs Review Labs Reviewed  CBC  BASIC METABOLIC PANEL  TROPONIN I    Imaging Review No results found.   EKG Interpretation   Date/Time:  Tuesday June 27 2014 14:28:44 EDT Ventricular Rate:  74 PR Interval:  150 QRS Duration: 86 QT Interval:  392 QTC Calculation: 435 R Axis:   28 Text Interpretation:  Normal sinus rhythm Normal ECG Confirmed by Juleen China   MD, Naoma Boxell ([id number]) on 06/27/2014 2:58:18 PM      MDM   Final diagnoses:  Chest pain, unspecified chest pain type    49 year old female with chest pain. Doubt ACS. Atypical symptoms. EKG non-concerning. Doubt dissection. Doubt pulmonary embolism. Suspect musculoskeletal etiology with reproducibility with palpation and movement.     Raeford Razor, MD 06/28/14 0111

## 2014-06-27 NOTE — Discharge Instructions (Signed)

## 2014-06-28 ENCOUNTER — Encounter: Payer: BLUE CROSS/BLUE SHIELD | Attending: "Endocrinology | Admitting: Nutrition

## 2014-06-28 ENCOUNTER — Encounter: Payer: Self-pay | Admitting: Nutrition

## 2014-06-28 VITALS — Ht 59.0 in | Wt 181.0 lb

## 2014-06-28 DIAGNOSIS — Z6836 Body mass index (BMI) 36.0-36.9, adult: Secondary | ICD-10-CM | POA: Diagnosis not present

## 2014-06-28 DIAGNOSIS — Z794 Long term (current) use of insulin: Secondary | ICD-10-CM | POA: Insufficient documentation

## 2014-06-28 DIAGNOSIS — E118 Type 2 diabetes mellitus with unspecified complications: Secondary | ICD-10-CM | POA: Insufficient documentation

## 2014-06-28 DIAGNOSIS — Z713 Dietary counseling and surveillance: Secondary | ICD-10-CM | POA: Diagnosis not present

## 2014-06-28 DIAGNOSIS — E669 Obesity, unspecified: Secondary | ICD-10-CM | POA: Diagnosis not present

## 2014-06-28 DIAGNOSIS — IMO0002 Reserved for concepts with insufficient information to code with codable children: Secondary | ICD-10-CM

## 2014-06-28 DIAGNOSIS — E1165 Type 2 diabetes mellitus with hyperglycemia: Secondary | ICD-10-CM

## 2014-06-28 NOTE — Progress Notes (Signed)
  Medical Nutrition Therapy:  Appt start time: 0930 end time:  1030.  Assessment:  Primary concerns today:Diabetes. Lives with her husband. Most recent A1C 8.9%. Taking 50 units of Levemir at night, Metforim 1000 mg BID Apidra 12 units before meals TID Heart attack Oct 2015. Had a stent put in.  She does the cooking and shopping at home. Meal are prepared mostly baked but has been fried in the past. Eats three meals per day. Frustrated that she has gained 20 lbs since seeing Dr. Fransico Him and going on insulin. Had lost 70 lbs previously. Physical Actvity: Walks 5 miles three times per week. Drinks water.  Mouth is dry, thirsty, urniating a lot, tired, fatigued. FBS:  222 mg/dl or higher. Before lunch 151 and before dinner 167 mg/dl before bed 024 mg/dl/ Injects in stomach or back of arm.    Diet is excessive in CHO at times causing elevated blood sugars. Eating snacks between meals may be adding extra calories. Diet is low in fresh fruits and vegetables and high fiber foods. Drinks diet sodas and needs to change to water. Getting excellent amount of exercise.  Complains of chronic constipation.  Preferred Learning Style:  none  Auditory  Visual  Hands on  Learning Readiness: Ready  Change in progress  MEDICATIONS: See list   DIETARY INTAKE:  24-hr recall:  B ( AM): Cherrios 2 cups, whole milk, Diet Dr. Reino Kent or water  Snk ( AM): none  L ( PM): Roast beef, carrots, red potatoes, water Snk ( PM): apple, water D ( PM): taco salad without sell-ltettuce, tomatoes,onions and beef, with FF Ranch, Diet Dr Reino Kent Snk ( PM): none Beverages: Water and Diet Dt Pepper  Usual physical activity: walking 5 miles three per week.  Estimated energy needs: 1200 calories 135 g carbohydrates 90 g protein 33 g fat  Progress Towards Goal(s):  In progress.   Nutritional Diagnosis:  NB-1.1 Food and nutrition-related knowledge deficit As related to diabetes.  As evidenced by A1C 9.8%.     Intervention:  Nutrition counseling and diabetes education provided on diet, meal planning, CHO Counting, My Plate, target ranges and treatment of blood sugars, benefits of weight loss and exercise, low fat low sodium high fiber diet and prevention of complications from DM.  Goal:  1. Eat three balanced meals as discussed per My Plate. 2. Eat 2-3 Carb choices per meal. 3. Cut out diet sodas  4. Increase water intake 5. Reduce portion sizes. 6. Increase low carb vegetables. 7. Avoid snacks between meals. 8. Get A1C down to 8% in three months. 9. Lose 1 lb per week til next visit. Inject insulin in abdomen for best absorption rates.  Teaching Method Utilized:  Visual Auditory Hands on  Handouts given during visit include:  The Plate Method  Meal Plan Card    Barriers to learning/adherence to lifestyle change: None  Demonstrated degree of understanding via:  Teach Back   Monitoring/Evaluation:  Dietary intake, exercise, meal planning, SBG, and body weight in 1 month(s).

## 2014-06-28 NOTE — Patient Instructions (Signed)
Goal:  1. Eat three balanced meals as discussed per My Plate. 2. Eat 2-3 Carb choices per meal. 3. Cut out diet sodas  4. Increase water intake 5. Reduce portion sizes. 6. Increase low carb vegetables. 7. Avoid snacks between meals. 8. Get A1C down to 8% in three months. 9. Lose 1 lb per week til next visit

## 2014-08-10 ENCOUNTER — Ambulatory Visit: Payer: BLUE CROSS/BLUE SHIELD | Admitting: Nutrition

## 2015-02-12 ENCOUNTER — Encounter: Payer: Self-pay | Admitting: Internal Medicine

## 2015-02-13 ENCOUNTER — Ambulatory Visit: Payer: Self-pay | Admitting: "Endocrinology

## 2015-02-19 ENCOUNTER — Ambulatory Visit: Payer: Self-pay | Admitting: "Endocrinology

## 2015-02-21 ENCOUNTER — Ambulatory Visit: Payer: BLUE CROSS/BLUE SHIELD | Admitting: Gastroenterology

## 2015-02-28 ENCOUNTER — Encounter: Payer: Self-pay | Admitting: Gastroenterology

## 2015-02-28 ENCOUNTER — Ambulatory Visit (INDEPENDENT_AMBULATORY_CARE_PROVIDER_SITE_OTHER): Payer: BLUE CROSS/BLUE SHIELD | Admitting: Gastroenterology

## 2015-02-28 VITALS — BP 127/68 | HR 60 | Temp 97.9°F | Ht 59.0 in | Wt 195.8 lb

## 2015-02-28 DIAGNOSIS — K5900 Constipation, unspecified: Secondary | ICD-10-CM | POA: Diagnosis not present

## 2015-02-28 DIAGNOSIS — R109 Unspecified abdominal pain: Secondary | ICD-10-CM

## 2015-02-28 MED ORDER — LUBIPROSTONE 24 MCG PO CAPS: 24.0000 ug | ORAL_CAPSULE | Freq: Two times a day (BID) | ORAL | Status: DC

## 2015-02-28 NOTE — Patient Instructions (Signed)
Let's stop Linzess for now. Start Amitiza 1 gelcap twice a day WITH FOOD to prevent nausea.   Continue Prilosec for now.  Call me in 2 weeks with an update. If you are not significantly better, I suggest an upper endoscopy.  I will see you in 6-8 weeks regardless.   Merry Christmas!

## 2015-03-09 ENCOUNTER — Encounter: Payer: Self-pay | Admitting: Gastroenterology

## 2015-03-09 ENCOUNTER — Telehealth: Payer: Self-pay | Admitting: Gastroenterology

## 2015-03-09 NOTE — Telephone Encounter (Signed)
I saw patient recently in clinic, and she is scheduled to see me again in Feb 2017. At time of visit, I had no records.   Since I have seen her, it appears she had an EGD and colonoscopy by Dr. Randa Evens around 2013. Can we obtain this? Also, any colonoscopy from Dr. Teena Dunk last year.   Thanks!

## 2015-03-09 NOTE — Progress Notes (Signed)
Primary Care Physician:  Samuel Jester, DO Primary Gastroenterologist:  Dr. Jena Gauss   Chief Complaint  Patient presents with  . gastroparesis  . Bloated  . Constipation    HPI:   BRIN RUGGERIO is a 49 y.o. female presenting today at the request of Dr. Charm Barges with reason: gastroparesis. However, she denies any diagnosis of this, and it is unclear where this originated. At time of visit, I did not have any records from prior work-up.   She underwent and EGD and colonoscopy by Dr. Randa Evens in 2013, but this is not available at time of visit. She then underwent an EUS by Dr. Dulce Sellar due to a gastric nodule on EGD. This was favored to be a benign leiomyoma or focal thickening of muscularis propria. Recommendations to consider repeat EUS in 1-2 years, which does not appear to have been done. Patient tells me that Dr. Teena Dunk performed a colonoscopy last year. Again, none of these reports are available to me at time of visit.   She tells me she has chronic LUQ pain for the past few years, feels like it is moving. Feels knot in epigastric region. Bloated, uncomfortable. Pain is constant and worse with eating. Feels nauseated but no vomiting. Chronic constipation. Taking Linzess 290 mcg once daily but still has to strain at times. Otherwise, mostly water. Has never tried Amitiza. Pain sometimes better after a BM. Stomach always feels bloated. Takes Prilosec 40 mg daily. NO unexplained weight loss or lack of appetite.     Past Medical History  Diagnosis Date  . Diabetes mellitus   . Hypertension   . High cholesterol   . GERD (gastroesophageal reflux disease)   . H/O hiatal hernia   . Neuromuscular disorder (HCC)     neuropathy; carpal tunnel  . MI (myocardial infarction) [facility])     Past Surgical History  Procedure Laterality Date  . Cholecystectomy    . Cesarean section    . Eus  11/18/2011    Dr. Dulce Sellar: gastric nodule, suspect benign leiomyoma or focal thicekning of muscularis propria  with extension to the pyloric channel. Consider repeat EUS in 1-2 years.   . Coronary angioplasty with stent placement    . Colonoscopy with esophagogastroduodenoscopy (egd)      Dr. Randa Evens.     Current Outpatient Prescriptions  Medication Sig Dispense Refill  . amLODipine (NORVASC) 5 MG tablet Take 5 mg by mouth daily.    Marland Kitchen aspirin EC 81 MG tablet Take 81 mg by mouth daily.    Marland Kitchen atorvastatin (LIPITOR) 40 MG tablet Take 40 mg by mouth every evening.    . clopidogrel (PLAVIX) 75 MG tablet Take 75 mg by mouth daily. Reported on 02/28/2015    . furosemide (LASIX) 20 MG tablet Take 20 mg by mouth daily.    Marland Kitchen gabapentin (NEURONTIN) 100 MG capsule Take 100 mg by mouth 4 (four) times daily.    . Insulin Aspart (NOVOLOG [place]) Inject into the skin. Sliding  scale    . Insulin Glargine (TOUJEO SOLOSTAR [place]) Inject 60 Units into the skin at bedtime.    . Linaclotide (LINZESS) 290 MCG CAPS capsule Take 290 mcg by mouth daily.    . metFORMIN (GLUCOPHAGE) 500 MG tablet Take 500 mg by mouth 4 (four) times daily.    Marland Kitchen omeprazole (PRILOSEC) 40 MG capsule Take 40 mg by mouth daily.    . Oxycodone HCl 10 MG TABS Take 10 mg by mouth 4 (four) times daily.    Marland Kitchen  Probiotic Product (ALIGN) 4 MG CAPS Take by mouth daily.    Marland Kitchen lubiprostone (AMITIZA) 24 MCG capsule Take 1 capsule (24 mcg total) by mouth 2 (two) times daily with a meal. 60 capsule 3   No current facility-administered medications for this visit.    Allergies as of 02/28/2015  . (No Known Allergies)    Family History  Problem Relation Age of Onset  . Colon cancer Neg Hx     Social History   Social History  . Marital Status: Married    Spouse Name: N/A  . Number of Children: N/A  . Years of Education: N/A   Occupational History  . Not on file.   Social History Main Topics  . Smoking status: Current Every Day Smoker -- 1.00 packs/day    Types: Cigarettes  . Smokeless tobacco: Not on file  . Alcohol Use: No  . Drug Use: No  . Sexual  Activity: Not on file   Other Topics Concern  . Not on file   Social History Narrative    Review of Systems: Gen: Denies any fever, chills, fatigue, weight loss, lack of appetite.  CV: Denies chest pain, heart palpitations, peripheral edema, syncope.  Resp: Denies shortness of breath at rest or with exertion. Denies wheezing or cough.  GI: see HPI  GU : Denies urinary burning, urinary frequency, urinary hesitancy MS: +neuropathy Derm: Denies rash, itching, dry skin Psych: Denies depression, anxiety, memory loss, and confusion Heme: Denies bruising, bleeding, and enlarged lymph nodes.  Physical Exam: BP 127/68 mmHg  Pulse 60  Temp(Src) 97.9 F (36.6 C)  Ht 4\' 11"  (1.499 m)  Wt 195 lb 12.8 oz (88.814 kg)  BMI 39.53 kg/m2 General:   Alert and oriented. Pleasant and cooperative. Well-nourished and well-developed.  Head:  Normocephalic and atraumatic. Eyes:  Without icterus, sclera clear and conjunctiva pink.  Ears:  Normal auditory acuity. Nose:  No deformity, discharge,  or lesions. Mouth:  No deformity or lesions, oral mucosa pink.  Lungs:  Clear to auscultation bilaterally. No wheezes, rales, or rhonchi. No distress.  Heart:  S1, S2 present without murmurs appreciated.  Abdomen:  +BS, soft, large/rounded AP diameter. Obese. TTP epigastric region. Query a ventral hernia.  Rectal:  Deferred  Msk:  Symmetrical without gross deformities. Normal posture. Extremities:  Without edema. Neurologic:  Alert and  oriented x4;  grossly normal neurologically. Psych:  Alert and cooperative. Normal mood and affect.

## 2015-03-09 NOTE — Telephone Encounter (Signed)
Also, to the clinical pool:  Appears patient was supposed to have a surveillance EUS to reassess a gastric nodule. She last had this in 2013 by Dr. Dulce Sellar from what I can see. Please refer back to Dr. Dulce Sellar for EGD/EUS for surveillance of gastric nodule WITH FNA biopsy likely.

## 2015-03-09 NOTE — Assessment & Plan Note (Signed)
49 year old female with a constellation of GI symptoms that appears to be chronic, as she has been evaluated by Dr. Randa Evens several years ago (colonoscopy/EGD unavailable at time of visit), and she had an EUS with Dr. Dulce Sellar in 2013 due to a gastric nodule noted on EGD. Appears this was to be repeated for surveillance, but it does not appear this is the case. Multiple complaints of bloating, occasional nausea, pain worsened by eating. No dysphagia, unexplained weight loss, or lack of appetite. From what she states, a colonoscopy was done last year.  Her symptoms could very well be related to underlying constipation. Doubt mesenteric ischemia, as her symptoms are not typical. She does need a repeat upper GI evaluation due to history of gastric nodule. Will need to obtain all outside procedure notes. As Dr. Dulce Sellar performed last EUS, I recommend referring back to him for surveillance in near future. Will arrange this through our office. EGD should be completed at same time if Dr. Dulce Sellar is willing.   For now: continue PPI daily. Stop Linzess. Start Amitiza 24 mcg po BID. Will attempt to obtain outside records. Refer to Dr. Dulce Sellar for surveillance EGD/EUS. Return in Feb 2017.

## 2015-03-13 ENCOUNTER — Other Ambulatory Visit: Payer: Self-pay

## 2015-03-13 DIAGNOSIS — K3189 Other diseases of stomach and duodenum: Secondary | ICD-10-CM

## 2015-03-13 NOTE — Progress Notes (Signed)
cc'ed to pcp °

## 2015-03-13 NOTE — Telephone Encounter (Signed)
REFERRAL HAS BEEN MADE  

## 2015-03-13 NOTE — Telephone Encounter (Signed)
I resent the release of records again today.

## 2015-04-09 ENCOUNTER — Encounter (HOSPITAL_COMMUNITY): Payer: Self-pay | Admitting: *Deleted

## 2015-04-11 ENCOUNTER — Encounter (HOSPITAL_COMMUNITY): Payer: Self-pay

## 2015-04-11 ENCOUNTER — Encounter (HOSPITAL_COMMUNITY)
Admission: RE | Disposition: A | Discharge status: Home / Self Care | Payer: Self-pay | Source: Ambulatory Visit | Attending: Gastroenterology

## 2015-04-11 ENCOUNTER — Ambulatory Visit (HOSPITAL_COMMUNITY): Payer: BLUE CROSS/BLUE SHIELD | Admitting: Anesthesiology

## 2015-04-11 ENCOUNTER — Other Ambulatory Visit: Payer: Self-pay | Admitting: Gastroenterology

## 2015-04-11 ENCOUNTER — Ambulatory Visit (HOSPITAL_COMMUNITY)
Admission: RE | Admit: 2015-04-11 | Discharge: 2015-04-11 | Disposition: A | Discharge status: Home / Self Care | Payer: BLUE CROSS/BLUE SHIELD | Source: Ambulatory Visit | Attending: Gastroenterology | Admitting: Gastroenterology

## 2015-04-11 DIAGNOSIS — F17210 Nicotine dependence, cigarettes, uncomplicated: Secondary | ICD-10-CM | POA: Diagnosis not present

## 2015-04-11 DIAGNOSIS — Z794 Long term (current) use of insulin: Secondary | ICD-10-CM | POA: Insufficient documentation

## 2015-04-11 DIAGNOSIS — K3189 Other diseases of stomach and duodenum: Secondary | ICD-10-CM | POA: Insufficient documentation

## 2015-04-11 DIAGNOSIS — E119 Type 2 diabetes mellitus without complications: Secondary | ICD-10-CM | POA: Diagnosis not present

## 2015-04-11 DIAGNOSIS — Z79899 Other long term (current) drug therapy: Secondary | ICD-10-CM | POA: Insufficient documentation

## 2015-04-11 DIAGNOSIS — I10 Essential (primary) hypertension: Secondary | ICD-10-CM | POA: Insufficient documentation

## 2015-04-11 DIAGNOSIS — K219 Gastro-esophageal reflux disease without esophagitis: Secondary | ICD-10-CM | POA: Insufficient documentation

## 2015-04-11 DIAGNOSIS — E7800 Pure hypercholesterolemia, unspecified: Secondary | ICD-10-CM | POA: Diagnosis not present

## 2015-04-11 DIAGNOSIS — Z6838 Body mass index (BMI) 38.0-38.9, adult: Secondary | ICD-10-CM | POA: Diagnosis not present

## 2015-04-11 DIAGNOSIS — E039 Hypothyroidism, unspecified: Secondary | ICD-10-CM | POA: Insufficient documentation

## 2015-04-11 DIAGNOSIS — Z79891 Long term (current) use of opiate analgesic: Secondary | ICD-10-CM | POA: Insufficient documentation

## 2015-04-11 DIAGNOSIS — R1012 Left upper quadrant pain: Secondary | ICD-10-CM | POA: Diagnosis not present

## 2015-04-11 DIAGNOSIS — Z955 Presence of coronary angioplasty implant and graft: Secondary | ICD-10-CM | POA: Diagnosis not present

## 2015-04-11 DIAGNOSIS — I252 Old myocardial infarction: Secondary | ICD-10-CM | POA: Diagnosis not present

## 2015-04-11 HISTORY — PX: EUS: SHX5427

## 2015-04-11 HISTORY — DX: Hypothyroidism, unspecified: E03.9

## 2015-04-11 HISTORY — DX: Personal history of urinary calculi: Z87.442

## 2015-04-11 HISTORY — PX: ESOPHAGOGASTRODUODENOSCOPY: SHX5428

## 2015-04-11 LAB — GLUCOSE, CAPILLARY: GLUCOSE-CAPILLARY: 155 mg/dL — AB (ref 65–99)

## 2015-04-11 SURGERY — EGD (ESOPHAGOGASTRODUODENOSCOPY)
Anesthesia: Monitor Anesthesia Care

## 2015-04-11 MED ORDER — PROPOFOL 10 MG/ML IV BOLUS
INTRAVENOUS | Status: AC
  Filled 2015-04-11: qty 40

## 2015-04-11 MED ORDER — LACTATED RINGERS IV SOLN
INTRAVENOUS | Status: DC
  Administered 2015-04-11: 09:00:00 via INTRAVENOUS

## 2015-04-11 MED ORDER — EPHEDRINE SULFATE 50 MG/ML IJ SOLN
INTRAMUSCULAR | Status: DC | PRN
  Administered 2015-04-11 (×2): 10 mg via INTRAVENOUS

## 2015-04-11 MED ORDER — PROPOFOL 500 MG/50ML IV EMUL
INTRAVENOUS | Status: DC | PRN
  Administered 2015-04-11: 100 ug/kg/min via INTRAVENOUS

## 2015-04-11 MED ORDER — LIDOCAINE HCL (PF) 2 % IJ SOLN
INTRAMUSCULAR | Status: DC | PRN
  Administered 2015-04-11: 20 mg via INTRADERMAL

## 2015-04-11 MED ORDER — PROPOFOL 10 MG/ML IV BOLUS
INTRAVENOUS | Status: AC
  Filled 2015-04-11: qty 20

## 2015-04-11 MED ORDER — PROPOFOL 10 MG/ML IV BOLUS
INTRAVENOUS | Status: DC | PRN
  Administered 2015-04-11 (×2): 50 mg via INTRAVENOUS
  Administered 2015-04-11: 80 mg via INTRAVENOUS
  Administered 2015-04-11: 20 mg via INTRAVENOUS

## 2015-04-11 MED ORDER — SODIUM CHLORIDE 0.9 % IV SOLN: INTRAVENOUS | Status: DC

## 2015-04-11 NOTE — Anesthesia Preprocedure Evaluation (Signed)
Anesthesia Evaluation  Patient identified by MRN, date of birth, ID band Patient awake    Reviewed: Allergy & Precautions, NPO status , Patient's Chart, lab work & pertinent test results  History of Anesthesia Complications Negative for: history of anesthetic complications  Airway Mallampati: II  TM Distance: >3 FB Neck ROM: Full    Dental no notable dental hx. (+) Dental Advisory Given   Pulmonary Current Smoker,    Pulmonary exam normal breath sounds clear to auscultation       Cardiovascular hypertension, Pt. on medications + Past MI and + Cardiac Stents (stent in 2015)  Normal cardiovascular exam Rhythm:Regular Rate:Normal     Neuro/Psych negative neurological ROS  negative psych ROS   GI/Hepatic Neg liver ROS, GERD  Medicated and Controlled,  Endo/Other  diabetesHypothyroidism Morbid obesity  Renal/GU negative Renal ROS  negative genitourinary   Musculoskeletal negative musculoskeletal ROS (+)   Abdominal   Peds negative pediatric ROS (+)  Hematology negative hematology ROS (+)   Anesthesia Other Findings   Reproductive/Obstetrics negative OB ROS                             Anesthesia Physical Anesthesia Plan  ASA: III  Anesthesia Plan: MAC   Post-op Pain Management:    Induction: Intravenous  Airway Management Planned: Natural Airway  Additional Equipment:   Intra-op Plan:   Post-operative Plan:   Informed Consent: I have reviewed the patients History and Physical, chart, labs and discussed the procedure including the risks, benefits and alternatives for the proposed anesthesia with the patient or authorized representative who has indicated his/her understanding and acceptance.   Dental advisory given  Plan Discussed with: CRNA  Anesthesia Plan Comments:         Anesthesia Quick Evaluation

## 2015-04-11 NOTE — Op Note (Signed)
[facility] [address] [place], [address]   ENDOSCOPY PROCEDURE REPORT  PATIENT: Samantha, Li  MR#: [id number] BIRTHDATE: 10-23-1965 , 50  yrs. old GENDER: female ENDOSCOPIST: Willis Modena, MD REFERRED BY:  Carman Ching, M.D. PROCEDURE DATE:  30-Apr-2015 PROCEDURE:  EGD, diagnostic ASA CLASS:     Class III INDICATIONS:  abdominal pain, gastric nodule. MEDICATIONS: Monitored anesthesia care TOPICAL ANESTHETIC:  DESCRIPTION OF PROCEDURE: After the risks benefits and alternatives of the procedure were thoroughly explained, informed consent was obtained.  The Pentax Gastroscope [id number] endoscope was introduced through the mouth and advanced to the second portion of the duodenum. The instrument was slowly withdrawn as the mucosa was fully examined. Estimated blood loss is zero unless otherwise noted in this procedure report.    Findings:  Normal esophagus.  Retained gastric contents noted, which obscured some views of the gravity-dependent stomach.  Submucosal nodule with central umbilication noted in pre-pyloric antrum, but without evidence of gastric outlet obstruction.  Normal duodenum to second portion.  Retroflexion into the cardia was normal. The scope was then withdrawn from the patient and the procedure completed.  COMPLICATIONS: There were no immediate complications.  ENDOSCOPIC IMPRESSION:     As above.  Gastric nodule further discussed in EUS report.  Retained gastric contents suggestive of gastroparesis.  RECOMMENDATIONS:     1.  Watch for potential complications of procedure. 2.  EUS today under same sedation.  eSigned:  Willis Modena, MD 04/30/2015 10:07 AM   CC:  CPT CODES: ICD CODES:  The ICD and CPT codes recommended by this software are interpretations from the data that the clinical staff has captured with the software.  The verification of the translation of this report to the ICD and CPT codes and modifiers is the  sole responsibility of the health care institution and practicing physician where this report was generated.  [employer] will not be held responsible for the validity of the ICD and CPT codes included on this report.  AMA assumes no liability for data contained or not contained herein. CPT is a Publishing rights manager of the [employer].

## 2015-04-11 NOTE — Addendum Note (Signed)
Addended by: Mashanda Ishibashi on: 04/11/2015 08:29 AM   Modules accepted: Orders  

## 2015-04-11 NOTE — Progress Notes (Signed)
Patient had a left thumb IV which was removed per protocol and intact at removal.

## 2015-04-11 NOTE — Discharge Instructions (Signed)

## 2015-04-11 NOTE — Op Note (Signed)
[facility] [address] [place], [address]   ENDOSCOPIC ULTRASOUND PROCEDURE REPORT  PATIENT: [name], [name]  MR#: [id number] BIRTHDATE: 1965/06/10  GENDER: female ENDOSCOPIST: Willis Modena, MD REFERRED BY:  Carman Ching, M.D. PROCEDURE DATE:  04/11/2015 PROCEDURE:   Upper EUS ASA CLASS:      Class III INDICATIONS:   1.  gastric nodule. MEDICATIONS: Monitored anesthesia care  DESCRIPTION OF PROCEDURE:   After the risks benefits and alternatives of the procedure were  explained, informed consent was obtained. The patient was then placed in the left, lateral, decubitus postion and IV sedation was administered. Throughout the procedure, the patients blood pressure, pulse and oxygen saturations were monitored continuously.  Under direct visualization, the diagnostic endoscope and radial echoendoscopes were sequentially introduced through the mouth  and advanced to the second portion of the duodenum .  Water was used as necessary to provide an acoustic interface. Estimated blood loss is zero unless otherwise noted in this procedure report. Upon completion of the imaging, water was removed and the patient was sent to the recovery room in satisfactory condition.    FINDINGS:      Nodule is about 10 x 12 mm in size, salt-and-pepper consistency and has central umbilication.  Lesion is right by the pylorus so hard to avoid tangential views in this region.  IMPRESSION:     As above.  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  No significant change in size since her last EUS in September 2013.  RECOMMENDATIONS:     1.  Watch for potential complications of procedure. 2.  Would not pursue any further surveillance of gastric nodular lesion. 3.  Follow-up with Dr. Randa Evens for ongoing management of GI tract symptoms.   _______________________________ [place] DoctorWillis Modena, MD 04/11/2015 10:01 AM   CC:

## 2015-04-11 NOTE — Transfer of Care (Signed)
Immediate Anesthesia Transfer of Care Note  Patient: [name]  Procedure(s) Performed: Procedure(s): ESOPHAGOGASTRODUODENOSCOPY (EGD) (N/A) ESOPHAGEAL ENDOSCOPIC ULTRASOUND (EUS) RADIAL (N/A)  Patient Location: PACU  Anesthesia Type:MAC  Level of Consciousness:  sedated, patient cooperative and responds to stimulation  Airway & Oxygen Therapy:Patient Spontanous Breathing    Post-op Assessment:  Report given to PACU RN and Post -op Vital signs reviewed and stable  Post vital signs:  Reviewed and stable  Last Vitals:  Filed Vitals:   04/11/15 0902  BP: 130/45  Pulse: 62  Temp: 36.8 C  Resp: 16    Complications: No apparent anesthesia complications

## 2015-04-11 NOTE — Anesthesia Postprocedure Evaluation (Signed)
Anesthesia Post Note  Patient: [name]  Procedure(s) Performed: Procedure(s) (LRB): ESOPHAGOGASTRODUODENOSCOPY (EGD) (N/A) ESOPHAGEAL ENDOSCOPIC ULTRASOUND (EUS) RADIAL (N/A)  Patient location during evaluation: PACU Anesthesia Type: MAC Level of consciousness: awake and alert Pain management: pain level controlled Vital Signs Assessment: post-procedure vital signs reviewed and stable Respiratory status: spontaneous breathing, nonlabored ventilation, respiratory function stable and patient connected to nasal cannula oxygen Cardiovascular status: blood pressure returned to baseline and stable Postop Assessment: no signs of nausea or vomiting Anesthetic complications: no    Last Vitals:  Filed Vitals:   04/11/15 1010 04/11/15 1015  BP: 119/46   Pulse: 72 66  Temp:    Resp: 24 23    Last Pain: There were no vitals filed for this visit.               Debarah Mccumbers JENNETTE

## 2015-04-11 NOTE — H&P (Signed)
Patient interval history reviewed.  Patient examined again.  There has been no change from documented H/P dated 04/03/15 (scanned into chart from our office) except as documented above.  Assessment:  1.  Gastric nodule. 2.  Left upper quadrant abdominal discomfort.  Plan:  1.  Endoscopy and upper endoscopic ultrasound. 2.  Risks (bleeding, infection, bowel perforation that could require surgery, sedation-related changes in cardiopulmonary systems), benefits (identification and possible treatment of source of symptoms, exclusion of certain causes of symptoms), and alternatives (watchful waiting, radiographic imaging studies, empiric medical treatment) of upper endoscopy and upper endoscopic ultrasound (EGD + EUS) were explained to patient/family in detail and patient wishes to proceed.

## 2015-04-12 ENCOUNTER — Encounter (HOSPITAL_COMMUNITY): Payer: Self-pay | Admitting: Gastroenterology

## 2015-04-23 ENCOUNTER — Encounter: Payer: Self-pay | Admitting: Gastroenterology

## 2015-04-23 ENCOUNTER — Ambulatory Visit (INDEPENDENT_AMBULATORY_CARE_PROVIDER_SITE_OTHER): Payer: BLUE CROSS/BLUE SHIELD | Admitting: Gastroenterology

## 2015-04-23 ENCOUNTER — Other Ambulatory Visit: Payer: Self-pay

## 2015-04-23 VITALS — BP 130/83 | HR 69 | Temp 98.0°F | Ht 59.0 in | Wt 184.8 lb

## 2015-04-23 DIAGNOSIS — K5900 Constipation, unspecified: Secondary | ICD-10-CM

## 2015-04-23 DIAGNOSIS — R109 Unspecified abdominal pain: Secondary | ICD-10-CM

## 2015-04-23 DIAGNOSIS — R110 Nausea: Secondary | ICD-10-CM

## 2015-04-23 MED ORDER — NALOXEGOL OXALATE 25 MG PO TABS: 25.0000 mg | ORAL_TABLET | Freq: Every day | ORAL | Status: DC

## 2015-04-23 NOTE — Assessment & Plan Note (Signed)
Multifactorial in setting of constipation. EGD/EUS completed without need for further work-up of gastric nodule. Retained contents in stomach. Likely diabetic gastroparesis. Although pain is not typically seen with gastroparesis, her bloating could be resulting in abdominal discomfort, in the setting of delayed gastric emptying. Will proceed with GES. Low threshold for Reglan due to symptoms. Weight loss noted, unintentional. Will monitor closely. Consider abdominal imaging if persistent weight loss noted when returns in 4-6 weeks.

## 2015-04-23 NOTE — Progress Notes (Signed)
cc'ed to pcp °

## 2015-04-23 NOTE — Progress Notes (Signed)
NO PA is need for GES

## 2015-04-23 NOTE — Assessment & Plan Note (Signed)
Mild improvement with Amitiza 24 mcg BID. Failed Linzess. Will trial Movantik 25 mg po daily due to likely opioid-induced constipation. Return in 4-6 weeks.

## 2015-04-23 NOTE — Progress Notes (Signed)
Referring Provider: Samuel Jester, DO Primary Care Physician:  Samuel Jester, DO  Primary GI: Dr. Jena Gauss    Chief Complaint  Patient presents with  . Follow-up    HPI:   [name] is a 50 y.o. female presenting today with a history of bloating, nausea, constipation. Last seen Dec 2016 in evaluation. Colonoscopy up-to-date as of last year, recent EUS completed due to history of gastric nodule. This remains essentially unchanged. No need for further follow-up/surveillance of this. Prescribed Amitiza 24 mcg po BID at last visit.   Has lost about 11 lbs since last visit in Dec 2016. Unintentional. Amitiza helped somewhat with constipation but still has to strain. Still with BM only every few days. Notes chronic LUQ pain, worse after eating. Discomfort with bloating. Underlying nausea. Early satiety. EGD report notes stomach had retained food. Feels like if she could throw up, she would feel better. On chronic narcotic therapy.   Past Medical History  Diagnosis Date  . Diabetes mellitus   . Hypertension   . High cholesterol   . GERD (gastroesophageal reflux disease)   . H/O hiatal hernia   . MI (myocardial infarction) (HCC) 2015  . Hypothyroidism   . History of kidney stones   . Neuromuscular disorder (HCC)     neuropathy; carpal tunnel BOTH WRISTS    Past Surgical History  Procedure Laterality Date  . Cholecystectomy    . Cesarean section      X 3  . Eus  11/18/2011    Dr. Dulce Sellar: gastric nodule, suspect benign leiomyoma or focal thicekning of muscularis propria with extension to the pyloric channel. Consider repeat EUS in 1-2 years.   . Colonoscopy with esophagogastroduodenoscopy (egd)      Dr. Randa Evens.   . Coronary angioplasty with stent placement  2015    STENT X 1  . Uterine ablation    . Esophagogastroduodenoscopy N/A 04/11/2015    Dr. Outlaw:Gastric nodule   . Eus N/A 04/11/2015    Dr. Dulce Sellar: 10X65mm nodule, no significant change in size since last EUS Sept 2013.  no further surveillance needed   . Colonoscopy  April 2016    Dr. Teena Dunk: diverticulum in ascending colon, small internal hemorrhoids,   . Esophagogastroduodenoscopy  2013    Dr. Randa Evens: normal esophagus. medium-sized submucosal mass with no bleeding and no stigmata of recent bleeding, s/p biopsy. path: benign small bowel mucosa, surgical polyp with polypoid reactive-type gastropathy, negative h.pylori   . Colonoscopy  2013    Dr. Randa Evens: normal mucosa, internal hemorrhoids. path: benign colonic mucosa    Current Outpatient Prescriptions  Medication Sig Dispense Refill  . acetaminophen (TYLENOL) 500 MG tablet Take 500 mg by mouth every 6 (six) hours as needed (Pain).    Marland Kitchen aspirin EC 81 MG tablet Take 81 mg by mouth daily.    Marland Kitchen atorvastatin (LIPITOR) 40 MG tablet Take 40 mg by mouth every evening.    . clopidogrel (PLAVIX) 75 MG tablet Take 75 mg by mouth daily. Reported on 02/28/2015    . furosemide (LASIX) 20 MG tablet Take 20 mg by mouth daily.    Marland Kitchen gabapentin (NEURONTIN) 300 MG capsule Take 300 mg by mouth 2 (two) times daily.    . Insulin Aspart (NOVOLOG [place]) Inject 0-16 Units into the skin 3 (three) times daily. Sliding  scale    . insulin lispro (HUMALOG) 100 UNIT/ML injection Inject 60 Units into the skin at bedtime. Reported on 04/23/2015    . levothyroxine (  SYNTHROID, LEVOTHROID) 75 MCG tablet Take 75 mcg by mouth daily before breakfast. Reported on 04/23/2015    . losartan (COZAAR) 50 MG tablet Take 50 mg by mouth daily.    Marland Kitchen lubiprostone (AMITIZA) 24 MCG capsule Take 1 capsule (24 mcg total) by mouth 2 (two) times daily with a meal. 60 capsule 3  . metFORMIN (GLUCOPHAGE) 500 MG tablet Take 500 mg by mouth at bedtime.     Marland Kitchen omeprazole (PRILOSEC) 40 MG capsule Take 40 mg by mouth daily.    . Oxycodone HCl 10 MG TABS Take 10 mg by mouth every 4 (four) hours as needed (Pain).     Marland Kitchen JARDIANCE 25 MG TABS tablet Take 25 mg by mouth daily. Reported on 04/23/2015  0  . naloxegol oxalate  (MOVANTIK) 25 MG TABS tablet Take 1 tablet (25 mg total) by mouth daily. 1 hour before breakfast 30 tablet 3   No current facility-administered medications for this visit.    Allergies as of 04/23/2015  . (No Known Allergies)    Family History  Problem Relation Age of Onset  . Colon cancer Neg Hx     Social History   Social History  . Marital Status: Married    Spouse Name: N/A  . Number of Children: N/A  . Years of Education: N/A   Social History Main Topics  . Smoking status: Current Every Day Smoker -- 1.00 packs/day    Types: Cigarettes  . Smokeless tobacco: Never Used  . Alcohol Use: No  . Drug Use: No  . Sexual Activity: Not Asked   Other Topics Concern  . None   Social History Narrative    Review of Systems: As mentioned in HPI   Physical Exam: BP 130/83 mmHg  Pulse 69  Temp(Src) 98 F (36.7 C)  Ht  (1.499 m)  Wt 184 lb 12.8 oz (83.825 kg)  BMI 37.31 kg/m2  LMP 06/09/2014 General:   Alert and oriented. No distress noted. Pleasant and cooperative.  Head:  Normocephalic and atraumatic. Eyes:  Conjuctiva clear without scleral icterus. Abdomen:  +BS, soft, mild TTP LUQ and non-distended. No rebound or guarding. No HSM or masses noted. Msk:  Symmetrical without gross deformities. Normal posture. Extremities:  Without edema. Neurologic:  Alert and  oriented x4;  grossly normal neurologically. Psych:  Alert and cooperative. Normal mood and affect.

## 2015-04-23 NOTE — Patient Instructions (Addendum)
Stop Amitiza.   Start Movantik 1 tablet each morning on an empty stomach an hour before breakfast. If you happen to stop taking your pain medicine for a significant length of time, call us, because we would need to change you to a different medicine other than Movantik.  We have scheduled you for a gastric emptying study.  I will see you in 4-6 weeks.

## 2015-04-25 ENCOUNTER — Encounter (HOSPITAL_COMMUNITY): Payer: Self-pay

## 2015-04-25 ENCOUNTER — Encounter (HOSPITAL_COMMUNITY)
Admission: RE | Admit: 2015-04-25 | Discharge: 2015-04-25 | Disposition: A | Discharge status: Home / Self Care | Payer: BLUE CROSS/BLUE SHIELD | Source: Ambulatory Visit | Attending: Gastroenterology | Admitting: Gastroenterology

## 2015-04-25 DIAGNOSIS — R110 Nausea: Secondary | ICD-10-CM

## 2015-04-25 MED ORDER — TECHNETIUM TC 99M SULFUR COLLOID
2.0000 | Freq: Once | INTRAVENOUS | Status: AC | PRN
  Administered 2015-04-25: 2.1 via ORAL

## 2015-04-27 NOTE — Progress Notes (Signed)
Quick Note:  Interestingly, her stomach empties quickly. I am not sure what to make of this. If persistent pain, bloating, needs CT angiogram. ______

## 2015-04-30 ENCOUNTER — Telehealth: Payer: Self-pay | Admitting: Internal Medicine

## 2015-04-30 ENCOUNTER — Other Ambulatory Visit: Payer: Self-pay

## 2015-04-30 DIAGNOSIS — R109 Unspecified abdominal pain: Secondary | ICD-10-CM

## 2015-04-30 NOTE — Telephone Encounter (Signed)
Pt called while JL was at lunch and I told her that I would let JL know that she had called. Pt said that she had called and LM at 0830 this morning and no one has called her back. I told her that we have been seeing patients this morning and that the nurse was at lunch and would call her back.

## 2015-04-30 NOTE — Telephone Encounter (Signed)
I spoke with the pt.  

## 2015-05-06 ENCOUNTER — Emergency Department (HOSPITAL_COMMUNITY): Payer: BLUE CROSS/BLUE SHIELD

## 2015-05-06 ENCOUNTER — Encounter (HOSPITAL_COMMUNITY): Payer: Self-pay | Admitting: Emergency Medicine

## 2015-05-06 ENCOUNTER — Observation Stay (HOSPITAL_COMMUNITY)
Admission: EM | Admit: 2015-05-06 | Discharge: 2015-05-07 | Disposition: A | Discharge status: Home / Self Care | Payer: BLUE CROSS/BLUE SHIELD | Attending: Internal Medicine | Admitting: Internal Medicine

## 2015-05-06 DIAGNOSIS — I2510 Atherosclerotic heart disease of native coronary artery without angina pectoris: Secondary | ICD-10-CM | POA: Diagnosis not present

## 2015-05-06 DIAGNOSIS — Z87442 Personal history of urinary calculi: Secondary | ICD-10-CM | POA: Insufficient documentation

## 2015-05-06 DIAGNOSIS — Z794 Long term (current) use of insulin: Secondary | ICD-10-CM | POA: Insufficient documentation

## 2015-05-06 DIAGNOSIS — Z955 Presence of coronary angioplasty implant and graft: Secondary | ICD-10-CM | POA: Insufficient documentation

## 2015-05-06 DIAGNOSIS — I252 Old myocardial infarction: Secondary | ICD-10-CM | POA: Diagnosis not present

## 2015-05-06 DIAGNOSIS — G5603 Carpal tunnel syndrome, bilateral upper limbs: Secondary | ICD-10-CM | POA: Insufficient documentation

## 2015-05-06 DIAGNOSIS — K219 Gastro-esophageal reflux disease without esophagitis: Secondary | ICD-10-CM | POA: Insufficient documentation

## 2015-05-06 DIAGNOSIS — I10 Essential (primary) hypertension: Secondary | ICD-10-CM | POA: Diagnosis not present

## 2015-05-06 DIAGNOSIS — Z9049 Acquired absence of other specified parts of digestive tract: Secondary | ICD-10-CM | POA: Insufficient documentation

## 2015-05-06 DIAGNOSIS — E7800 Pure hypercholesterolemia, unspecified: Secondary | ICD-10-CM | POA: Diagnosis not present

## 2015-05-06 DIAGNOSIS — Z7984 Long term (current) use of oral hypoglycemic drugs: Secondary | ICD-10-CM | POA: Insufficient documentation

## 2015-05-06 DIAGNOSIS — E669 Obesity, unspecified: Secondary | ICD-10-CM | POA: Diagnosis not present

## 2015-05-06 DIAGNOSIS — Z800 Family history of malignant neoplasm of digestive organs: Secondary | ICD-10-CM | POA: Diagnosis not present

## 2015-05-06 DIAGNOSIS — E039 Hypothyroidism, unspecified: Secondary | ICD-10-CM | POA: Diagnosis present

## 2015-05-06 DIAGNOSIS — F17210 Nicotine dependence, cigarettes, uncomplicated: Secondary | ICD-10-CM | POA: Diagnosis not present

## 2015-05-06 DIAGNOSIS — R079 Chest pain, unspecified: Secondary | ICD-10-CM | POA: Diagnosis present

## 2015-05-06 DIAGNOSIS — Z7982 Long term (current) use of aspirin: Secondary | ICD-10-CM | POA: Insufficient documentation

## 2015-05-06 DIAGNOSIS — E119 Type 2 diabetes mellitus without complications: Secondary | ICD-10-CM | POA: Diagnosis present

## 2015-05-06 LAB — CBC WITH DIFFERENTIAL/PLATELET
Basophils Absolute: 0 10*3/uL (ref 0.0–0.1)
Basophils Relative: 0 %
EOS PCT: 1 %
Eosinophils Absolute: 0.1 10*3/uL (ref 0.0–0.7)
HEMATOCRIT: 40.9 % (ref 36.0–46.0)
HEMOGLOBIN: 13.5 g/dL (ref 12.0–15.0)
LYMPHS ABS: 0.8 10*3/uL (ref 0.7–4.0)
LYMPHS PCT: 7 %
MCH: 29.7 pg (ref 26.0–34.0)
MCHC: 33 g/dL (ref 30.0–36.0)
MCV: 90.1 fL (ref 78.0–100.0)
Monocytes Absolute: 0.3 10*3/uL (ref 0.1–1.0)
Monocytes Relative: 3 %
Neutro Abs: 9.6 10*3/uL — ABNORMAL HIGH (ref 1.7–7.7)
Neutrophils Relative %: 89 %
PLATELETS: 200 10*3/uL (ref 150–400)
RBC: 4.54 MIL/uL (ref 3.87–5.11)
RDW: 15 % (ref 11.5–15.5)
WBC: 10.7 10*3/uL — AB (ref 4.0–10.5)

## 2015-05-06 LAB — BASIC METABOLIC PANEL
Anion gap: 10 (ref 5–15)
BUN: 14 mg/dL (ref 6–20)
CO2: 23 mmol/L (ref 22–32)
CREATININE: 0.62 mg/dL (ref 0.44–1.00)
Calcium: 7.9 mg/dL — ABNORMAL LOW (ref 8.9–10.3)
Chloride: 98 mmol/L — ABNORMAL LOW (ref 101–111)
GFR calc Af Amer: >60 mL/min (ref >60)
GFR calc non Af Amer: >60 mL/min (ref >60)
Glucose, Bld: 204 mg/dL — ABNORMAL HIGH (ref 65–99)
Potassium: 3.9 mmol/L (ref 3.5–5.1)
Sodium: 131 mmol/L — ABNORMAL LOW (ref 135–145)

## 2015-05-06 LAB — TROPONIN I: Troponin I: <0.03 ng/mL (ref <0.031)

## 2015-05-06 MED ORDER — NITROGLYCERIN 0.4 MG SL SUBL
0.4000 mg | SUBLINGUAL_TABLET | SUBLINGUAL | Status: DC | PRN
  Filled 2015-05-06: qty 1

## 2015-05-06 MED ORDER — ASPIRIN 81 MG PO CHEW
324.0000 mg | CHEWABLE_TABLET | Freq: Once | ORAL | Status: AC
  Administered 2015-05-06: 324 mg via ORAL
  Filled 2015-05-06: qty 4

## 2015-05-06 MED ORDER — ONDANSETRON HCL 4 MG/2ML IJ SOLN
4.0000 mg | Freq: Three times a day (TID) | INTRAMUSCULAR | Status: AC | PRN
  Administered 2015-05-07: 4 mg via INTRAVENOUS
  Filled 2015-05-06: qty 2

## 2015-05-06 MED ORDER — SODIUM CHLORIDE 0.9 % IV SOLN
INTRAVENOUS | Status: AC
  Administered 2015-05-07: via INTRAVENOUS

## 2015-05-06 MED ORDER — HYDROMORPHONE HCL 1 MG/ML IJ SOLN
1.0000 mg | INTRAMUSCULAR | Status: AC | PRN
  Administered 2015-05-07: 1 mg via INTRAVENOUS
  Filled 2015-05-06: qty 1

## 2015-05-06 MED ORDER — MORPHINE SULFATE (PF) 4 MG/ML IV SOLN
4.0000 mg | INTRAVENOUS | Status: DC | PRN
  Administered 2015-05-06: 4 mg via INTRAVENOUS
  Filled 2015-05-06: qty 1

## 2015-05-06 NOTE — ED Notes (Signed)
Pt states she noticed chest discomfort yesterday but that "it went away". Today she woke up nauseated and has chest pain constantly all day despite taking 3 NTG tablets. Last NTG at 8:28 pm.

## 2015-05-06 NOTE — ED Provider Notes (Signed)
CSN: [id number]     Arrival date & time 05/06/15  2124 History   First MD Initiated Contact with Patient 05/06/15 2135     Chief Complaint  Patient presents with  . Chest Pain      HPI Pt was seen at 2145. Per pt, c/o gradual onset and persistence of multiple intermittent episodes of chest "pain" that began yesterday. Describes the pain as "aching," with radiation into her neck. Has been associated with nausea. Symptoms worsen with exertion. Symptoms improve after pt takes her SL ntg. Pt states her LD SL ntg was at 2030 PTA, "but the CP came back again." Denies palpitations, no cough, no abd pain, no vomiting/diarrhea, no back pain.    Past Medical History  Diagnosis Date  . Diabetes mellitus   . Hypertension   . High cholesterol   . GERD (gastroesophageal reflux disease)   . H/O hiatal hernia   . MI (myocardial infarction) (HCC) 2015  . Hypothyroidism   . History of kidney stones   . Neuromuscular disorder (HCC)     neuropathy; carpal tunnel BOTH WRISTS   Past Surgical History  Procedure Laterality Date  . Cholecystectomy    . Cesarean section      X 3  . Eus  11/18/2011    Dr. Dulce Sellar: gastric nodule, suspect benign leiomyoma or focal thicekning of muscularis propria with extension to the pyloric channel. Consider repeat EUS in 1-2 years.   . Colonoscopy with esophagogastroduodenoscopy (egd)      Dr. Randa Evens.   . Coronary angioplasty with stent placement  2015    STENT X 1  . Uterine ablation    . Esophagogastroduodenoscopy N/A 04/11/2015    Dr. Outlaw:Gastric nodule   . Eus N/A 04/11/2015    Dr. Dulce Sellar: 10X31mm nodule, no significant change in size since last EUS Sept 2013. no further surveillance needed   . Colonoscopy  April 2016    Dr. Teena Dunk: diverticulum in ascending colon, small internal hemorrhoids,   . Esophagogastroduodenoscopy  2013    Dr. Randa Evens: normal esophagus. medium-sized submucosal mass with no bleeding and no stigmata of recent bleeding, s/p biopsy. path:  benign small bowel mucosa, surgical polyp with polypoid reactive-type gastropathy, negative h.pylori   . Colonoscopy  2013    Dr. Randa Evens: normal mucosa, internal hemorrhoids. path: benign colonic mucosa   Family History  Problem Relation Age of Onset  . Colon cancer Neg Hx    Social History  Substance Use Topics  . Smoking status: Current Every Day Smoker -- 1.00 packs/day    Types: Cigarettes  . Smokeless tobacco: Never Used  . Alcohol Use: No    Review of Systems ROS: Statement: All systems negative except as marked or noted in the HPI; Constitutional: Negative for fever and chills. ; ; Eyes: Negative for eye pain, redness and discharge. ; ; ENMT: Negative for ear pain, hoarseness, nasal congestion, sinus pressure and sore throat. ; ; Cardiovascular: +CP. Negative for palpitations, diaphoresis, dyspnea and peripheral edema. ; ; Respiratory: Negative for cough, wheezing and stridor. ; ; Gastrointestinal: +nausea. Negative for vomiting, diarrhea, abdominal pain, blood in stool, hematemesis, jaundice and rectal bleeding. . ; ; Genitourinary: Negative for dysuria, flank pain and hematuria. ; ; Musculoskeletal: Negative for back pain and neck pain. Negative for swelling and trauma.; ; Skin: Negative for pruritus, rash, abrasions, blisters, bruising and skin lesion.; ; Neuro: Negative for headache, lightheadedness and neck stiffness. Negative for weakness, altered level of consciousness , altered mental status, extremity  weakness, paresthesias, involuntary movement, seizure and syncope.      Allergies  Review of patient's allergies indicates no known allergies.  Home Medications   Prior to Admission medications   Medication Sig Start Date End Date Taking? Authorizing Provider  acetaminophen (TYLENOL) 500 MG tablet Take 500 mg by mouth every 6 (six) hours as needed (Pain).   Yes Historical Provider, MD  aspirin EC 81 MG tablet Take 81 mg by mouth daily.   Yes Historical Provider, MD   atorvastatin (LIPITOR) 40 MG tablet Take 40 mg by mouth every evening.   Yes Historical Provider, MD  clopidogrel (PLAVIX) 75 MG tablet Take 75 mg by mouth daily. Reported on 02/28/2015   Yes Historical Provider, MD  furosemide (LASIX) 20 MG tablet Take 20 mg by mouth daily.   Yes Historical Provider, MD  gabapentin (NEURONTIN) 300 MG capsule Take 300 mg by mouth 2 (two) times daily.   Yes Historical Provider, MD  Insulin Aspart (NOVOLOG [place]) Inject 0-16 Units into the skin 3 (three) times daily as needed (Using according to sliding scale at home.). Sliding  scale   Yes Historical Provider, MD  insulin detemir (LEVEMIR) 100 UNIT/ML injection Inject 60 Units into the skin at bedtime.   Yes Historical Provider, MD  levothyroxine (SYNTHROID, LEVOTHROID) 75 MCG tablet Take 75 mcg by mouth daily before breakfast. Reported on 04/23/2015   Yes Historical Provider, MD  losartan (COZAAR) 50 MG tablet Take 50 mg by mouth daily.   Yes Historical Provider, MD  lubiprostone (AMITIZA) 24 MCG capsule Take 1 capsule (24 mcg total) by mouth 2 (two) times daily with a meal. 02/28/15  Yes Nira Retort, NP  metFORMIN (GLUCOPHAGE) 1000 MG tablet Take 1,000 mg by mouth daily with breakfast.   Yes Historical Provider, MD  naloxegol oxalate (MOVANTIK) 25 MG TABS tablet Take 1 tablet (25 mg total) by mouth daily. 1 hour before breakfast 04/23/15  Yes Nira Retort, NP  omeprazole (PRILOSEC) 40 MG capsule Take 40 mg by mouth daily.   Yes Historical Provider, MD  Oxycodone HCl 10 MG TABS Take 10 mg by mouth every 4 (four) hours as needed (Pain).    Yes Historical Provider, MD   BP 105/81 mmHg  Pulse 83  Resp 14  SpO2 96%  LMP 06/09/2014   Patient Vitals for the past 24 hrs:  BP Pulse Resp SpO2  05/06/15 2233 105/81 mmHg 83 14 96 %  05/06/15 2200 (!) 120/53 mmHg 79 20 99 %    Physical Exam  2150: Physical examination:  Nursing notes reviewed; Vital signs and O2 SAT reviewed;  Constitutional: Well developed, Well  nourished, Well hydrated, In no acute distress; Head:  Normocephalic, atraumatic; Eyes: EOMI, PERRL, No scleral icterus; ENMT: Mouth and pharynx normal, Mucous membranes moist; Neck: Supple, Full range of motion, No lymphadenopathy; Cardiovascular: Regular rate and rhythm, No gallop; Respiratory: Breath sounds clear & equal bilaterally, No wheezes.  Speaking full sentences with ease, Normal respiratory effort/excursion; Chest: Nontender, Movement normal; Abdomen: Soft, Nontender, Nondistended, Normal bowel sounds; Genitourinary: No CVA tenderness; Extremities: Pulses normal, No tenderness, No edema, No calf edema or asymmetry.; Neuro: AA&Ox3, Major CN grossly intact.  Speech clear. No gross focal motor or sensory deficits in extremities.; Skin: Color normal, Warm, Dry.   ED Course  Procedures (including critical care time) Labs Review  Imaging Review  I have personally reviewed and evaluated these images and lab results as part of my medical decision-making.   EKG Interpretation  Date/Time:  Sunday May 06 2015 21:31:33 EST Ventricular Rate:  84 PR Interval:  146 QRS Duration: 80 QT Interval:  369 QTC Calculation: 436 R Axis:   45 Text Interpretation:  Sinus rhythm When compared with ECG of 06/27/2014 No  significant change was found Confirmed by Abdirahim Flavell  MD, Arihana Ambrocio ([id number]) on  05/06/2015 9:36:22 PM      MDM  MDM Reviewed: nursing note, previous chart and vitals Reviewed previous: labs and ECG Interpretation: labs, ECG and x-ray      Results for orders placed or performed during the hospital encounter of 05/06/15  Basic metabolic panel  Result Value Ref Range   Sodium 131 (L) 135 - 145 mmol/L   Potassium 3.9 3.5 - 5.1 mmol/L   Chloride 98 (L) 101 - 111 mmol/L   CO2 23 22 - 32 mmol/L   Glucose, Bld 204 (H) 65 - 99 mg/dL   BUN 14 6 - 20 mg/dL   Creatinine, Ser 0.62 0.44 - 1.00 mg/dL   Calcium 7.9 (L) 8.9 - 10.3 mg/dL   GFR calc non Af Amer >60 >60 mL/min   GFR calc  Af Amer >60 >60 mL/min   Anion gap 10 5 - 15  Troponin I  Result Value Ref Range   Troponin I <0.03 <0.031 ng/mL  CBC with Differential  Result Value Ref Range   WBC 10.7 (H) 4.0 - 10.5 K/uL   RBC 4.54 3.87 - 5.11 MIL/uL   Hemoglobin 13.5 12.0 - 15.0 g/dL   HCT 40.9 36.0 - 46.0 %   MCV 90.1 78.0 - 100.0 fL   MCH 29.7 26.0 - 34.0 pg   MCHC 33.0 30.0 - 36.0 g/dL   RDW 15.0 11.5 - 15.5 %   Platelets 200 150 - 400 K/uL   Neutrophils Relative % 89 %   Neutro Abs 9.6 (H) 1.7 - 7.7 K/uL   Lymphocytes Relative 7 %   Lymphs Abs 0.8 0.7 - 4.0 K/uL   Monocytes Relative 3 %   Monocytes Absolute 0.3 0.1 - 1.0 K/uL   Eosinophils Relative 1 %   Eosinophils Absolute 0.1 0.0 - 0.7 K/uL   Basophils Relative 0 %   Basophils Absolute 0.0 0.0 - 0.1 K/uL     23 35:  ASA, SL ntg and IV morphine given for symptoms with improvement. Pt with significant cardiac hx, Heart score 4, will observation admit. T/C to [facility] Dr. Lovell Sheehan, case discussed, including:  HPI, pertinent PM/SHx, VS/PE, dx testing, ED course and treatment:  Agreeable to admit, requests to write temporary orders, obtain observation tele bed to team APAdmits.     Samuel Jester, DO 05/09/15 2101

## 2015-05-07 ENCOUNTER — Observation Stay (HOSPITAL_COMMUNITY): Payer: BLUE CROSS/BLUE SHIELD

## 2015-05-07 DIAGNOSIS — E039 Hypothyroidism, unspecified: Secondary | ICD-10-CM | POA: Diagnosis present

## 2015-05-07 DIAGNOSIS — Z794 Long term (current) use of insulin: Secondary | ICD-10-CM | POA: Diagnosis present

## 2015-05-07 DIAGNOSIS — R0789 Other chest pain: Secondary | ICD-10-CM

## 2015-05-07 DIAGNOSIS — E119 Type 2 diabetes mellitus without complications: Secondary | ICD-10-CM | POA: Diagnosis present

## 2015-05-07 DIAGNOSIS — R079 Chest pain, unspecified: Secondary | ICD-10-CM

## 2015-05-07 DIAGNOSIS — I10 Essential (primary) hypertension: Secondary | ICD-10-CM | POA: Diagnosis not present

## 2015-05-07 DIAGNOSIS — E7800 Pure hypercholesterolemia, unspecified: Secondary | ICD-10-CM | POA: Diagnosis present

## 2015-05-07 DIAGNOSIS — E038 Other specified hypothyroidism: Secondary | ICD-10-CM

## 2015-05-07 LAB — BASIC METABOLIC PANEL
Anion gap: 8 (ref 5–15)
BUN: 13 mg/dL (ref 6–20)
CHLORIDE: 104 mmol/L (ref 101–111)
CO2: 23 mmol/L (ref 22–32)
Calcium: 7.1 mg/dL — ABNORMAL LOW (ref 8.9–10.3)
Creatinine, Ser: 0.56 mg/dL (ref 0.44–1.00)
GFR calc Af Amer: >60 mL/min (ref >60)
GFR calc non Af Amer: >60 mL/min (ref >60)
GLUCOSE: 163 mg/dL — AB (ref 65–99)
Potassium: 3.7 mmol/L (ref 3.5–5.1)
Sodium: 135 mmol/L (ref 135–145)

## 2015-05-07 LAB — CBG MONITORING, ED
GLUCOSE-CAPILLARY: 142 mg/dL — AB (ref 65–99)
Glucose-Capillary: 129 mg/dL — ABNORMAL HIGH (ref 65–99)
Glucose-Capillary: 160 mg/dL — ABNORMAL HIGH (ref 65–99)
Glucose-Capillary: 136 mg/dL — ABNORMAL HIGH (ref 65–99)

## 2015-05-07 LAB — CBC
HEMATOCRIT: 35.2 % — AB (ref 36.0–46.0)
HEMOGLOBIN: 11.8 g/dL — AB (ref 12.0–15.0)
MCH: 30.4 pg (ref 26.0–34.0)
MCHC: 33.5 g/dL (ref 30.0–36.0)
MCV: 90.7 fL (ref 78.0–100.0)
Platelets: 186 10*3/uL (ref 150–400)
RBC: 3.88 MIL/uL (ref 3.87–5.11)
RDW: 15.1 % (ref 11.5–15.5)
WBC: 7.5 10*3/uL (ref 4.0–10.5)

## 2015-05-07 LAB — LIPID PANEL
Cholesterol: 138 mg/dL (ref 0–200)
HDL: 24 mg/dL — AB (ref >40)
LDL CALC: 44 mg/dL (ref 0–99)
TRIGLYCERIDES: 348 mg/dL — AB (ref <150)
Total CHOL/HDL Ratio: 5.8 RATIO
VLDL: 70 mg/dL — AB (ref 0–40)

## 2015-05-07 LAB — URINALYSIS, ROUTINE W REFLEX MICROSCOPIC
BILIRUBIN URINE: NEGATIVE
Glucose, UA: NEGATIVE mg/dL
Hgb urine dipstick: NEGATIVE
Ketones, ur: NEGATIVE mg/dL
Leukocytes, UA: NEGATIVE
NITRITE: NEGATIVE
Protein, ur: NEGATIVE mg/dL
SPECIFIC GRAVITY, URINE: 1.025 (ref 1.005–1.030)
pH: 5.5 (ref 5.0–8.0)

## 2015-05-07 LAB — TROPONIN I
Troponin I: <0.03 ng/mL (ref <0.031)
Troponin I: <0.03 ng/mL (ref <0.031)

## 2015-05-07 LAB — D-DIMER, QUANTITATIVE: D-Dimer, Quant: 0.67 ug/mL-FEU — ABNORMAL HIGH (ref 0.00–0.50)

## 2015-05-07 MED ORDER — NICOTINE 14 MG/24HR TD PT24: 14.0000 mg | MEDICATED_PATCH | Freq: Every day | TRANSDERMAL | Status: DC

## 2015-05-07 MED ORDER — ACETAMINOPHEN 325 MG PO TABS
650.0000 mg | ORAL_TABLET | Freq: Four times a day (QID) | ORAL | Status: DC | PRN
  Administered 2015-05-07: 650 mg via ORAL
  Filled 2015-05-07: qty 2

## 2015-05-07 MED ORDER — SODIUM CHLORIDE 0.9% FLUSH: 3.0000 mL | Freq: Two times a day (BID) | INTRAVENOUS | Status: DC

## 2015-05-07 MED ORDER — CLOPIDOGREL BISULFATE 75 MG PO TABS
75.0000 mg | ORAL_TABLET | Freq: Every day | ORAL | Status: DC
  Administered 2015-05-07: 75 mg via ORAL
  Filled 2015-05-07: qty 1

## 2015-05-07 MED ORDER — GABAPENTIN 300 MG PO CAPS
300.0000 mg | ORAL_CAPSULE | Freq: Two times a day (BID) | ORAL | Status: DC
  Administered 2015-05-07 (×2): 300 mg via ORAL
  Filled 2015-05-07 (×2): qty 1

## 2015-05-07 MED ORDER — LOSARTAN POTASSIUM 50 MG PO TABS
50.0000 mg | ORAL_TABLET | Freq: Every day | ORAL | Status: DC
  Administered 2015-05-07: 50 mg via ORAL
  Filled 2015-05-07 (×3): qty 1

## 2015-05-07 MED ORDER — ONDANSETRON HCL 4 MG PO TABS: 4.0000 mg | ORAL_TABLET | Freq: Four times a day (QID) | ORAL | Status: DC | PRN

## 2015-05-07 MED ORDER — OXYCODONE HCL 5 MG PO TABS: 10.0000 mg | ORAL_TABLET | ORAL | Status: DC | PRN

## 2015-05-07 MED ORDER — LEVOTHYROXINE SODIUM 50 MCG PO TABS
75.0000 ug | ORAL_TABLET | Freq: Every day | ORAL | Status: DC
  Administered 2015-05-07: 75 ug via ORAL
  Filled 2015-05-07: qty 2

## 2015-05-07 MED ORDER — SODIUM CHLORIDE 0.9% FLUSH: 3.0000 mL | INTRAVENOUS | Status: DC | PRN

## 2015-05-07 MED ORDER — HYDROMORPHONE HCL 1 MG/ML IJ SOLN: 0.5000 mg | INTRAMUSCULAR | Status: DC | PRN

## 2015-05-07 MED ORDER — SODIUM CHLORIDE 0.9 % IV SOLN: INTRAVENOUS | Status: DC

## 2015-05-07 MED ORDER — FUROSEMIDE 40 MG PO TABS
20.0000 mg | ORAL_TABLET | Freq: Every day | ORAL | Status: DC
  Administered 2015-05-07: 20 mg via ORAL
  Filled 2015-05-07: qty 1

## 2015-05-07 MED ORDER — NITROGLYCERIN 2 % TD OINT: 0.5000 [in_us] | TOPICAL_OINTMENT | Freq: Four times a day (QID) | TRANSDERMAL | Status: DC

## 2015-05-07 MED ORDER — SODIUM CHLORIDE 0.9 % IV SOLN: 250.0000 mL | INTRAVENOUS | Status: DC | PRN

## 2015-05-07 MED ORDER — INSULIN ASPART 100 UNIT/ML ~~LOC~~ SOLN: 0.0000 [IU] | Freq: Every day | SUBCUTANEOUS | Status: DC

## 2015-05-07 MED ORDER — AZITHROMYCIN 500 MG PO TABS: 500.0000 mg | ORAL_TABLET | Freq: Every day | ORAL | Status: DC

## 2015-05-07 MED ORDER — ENOXAPARIN SODIUM 30 MG/0.3ML ~~LOC~~ SOLN
30.0000 mg | SUBCUTANEOUS | Status: DC
  Administered 2015-05-07: 30 mg via SUBCUTANEOUS
  Filled 2015-05-07: qty 0.3

## 2015-05-07 MED ORDER — ATORVASTATIN CALCIUM 40 MG PO TABS
40.0000 mg | ORAL_TABLET | Freq: Every evening | ORAL | Status: DC
  Filled 2015-05-07 (×2): qty 1

## 2015-05-07 MED ORDER — ACETAMINOPHEN 650 MG RE SUPP: 650.0000 mg | Freq: Four times a day (QID) | RECTAL | Status: DC | PRN

## 2015-05-07 MED ORDER — ONDANSETRON HCL 4 MG/2ML IJ SOLN: 4.0000 mg | Freq: Four times a day (QID) | INTRAMUSCULAR | Status: DC | PRN

## 2015-05-07 MED ORDER — IOHEXOL 350 MG/ML SOLN
100.0000 mL | Freq: Once | INTRAVENOUS | Status: AC | PRN
  Administered 2015-05-07: 100 mL via INTRAVENOUS

## 2015-05-07 MED ORDER — ASPIRIN EC 81 MG PO TBEC
81.0000 mg | DELAYED_RELEASE_TABLET | Freq: Every day | ORAL | Status: DC
  Administered 2015-05-07: 81 mg via ORAL
  Filled 2015-05-07: qty 1

## 2015-05-07 MED ORDER — ALUM & MAG HYDROXIDE-SIMETH 200-200-20 MG/5ML PO SUSP: 30.0000 mL | Freq: Four times a day (QID) | ORAL | Status: DC | PRN

## 2015-05-07 MED ORDER — NICOTINE 14 MG/24HR TD PT24
14.0000 mg | MEDICATED_PATCH | Freq: Every day | TRANSDERMAL | Status: DC
  Administered 2015-05-07: 14 mg via TRANSDERMAL
  Filled 2015-05-07 (×2): qty 1

## 2015-05-07 MED ORDER — INSULIN DETEMIR 100 UNIT/ML ~~LOC~~ SOLN
60.0000 [IU] | Freq: Every day | SUBCUTANEOUS | Status: DC
  Filled 2015-05-07: qty 0.6

## 2015-05-07 MED ORDER — PANTOPRAZOLE SODIUM 40 MG PO TBEC
40.0000 mg | DELAYED_RELEASE_TABLET | Freq: Every day | ORAL | Status: DC
  Administered 2015-05-07: 40 mg via ORAL
  Filled 2015-05-07: qty 1

## 2015-05-07 MED ORDER — ALBUTEROL SULFATE HFA 108 (90 BASE) MCG/ACT IN AERS: 2.0000 | INHALATION_SPRAY | Freq: Four times a day (QID) | RESPIRATORY_TRACT | Status: DC | PRN

## 2015-05-07 MED ORDER — INSULIN ASPART 100 UNIT/ML ~~LOC~~ SOLN
0.0000 [IU] | Freq: Three times a day (TID) | SUBCUTANEOUS | Status: DC
  Administered 2015-05-07 (×2): 1 [IU] via SUBCUTANEOUS
  Filled 2015-05-07 (×2): qty 1

## 2015-05-07 NOTE — Care Management (Signed)
CM consult received. F/u appointments made with cardiologist and PCP.

## 2015-05-07 NOTE — H&P (Signed)
[facility] Admission History and Physical       [name] UUV:[id number] DOB: 10/09/1965 DOA: 05/06/2015  Referring physician: EDP PCP: Samuel Jester, DO  Specialists:   Chief Complaint: Chest Pain  HPI: [name] is a 50 y.o. female with a history of CAD/MI  (2015) S/P PCI with Stent in Cx, HTN, IDDM, Hyperlipidemia, andTobacco Use who presents to the ED with complaints of intermittent left sided Chest pain x 2 days.  She describes the pain as heaviness that radiates into her neck and back.  At the worse the pain has been a 10/10.   She reports having associated symptoms of SOB, Nausea but no Vomiting or Diaphoresis.    She was evaluated in the ED and was found to have a negative initial workup and was referred for further evaluation.   Her Cardiologist is a Dr  Conni Slipper, and her Stent was placed by Dr. Titus Mould at [facility].      Review of Systems:  Constitutional: No Weight Loss, No Weight Gain, Night Sweats, Fevers, Chills, Dizziness, Light Headedness, Fatigue, or Generalized Weakness HEENT: No Headaches, Difficulty Swallowing,Tooth/Dental Problems,Sore Throat,  No Sneezing, Rhinitis, Ear Ache, Nasal Congestion, or Post Nasal Drip,  Cardio-vascular:   +Chest pain, Orthopnea, PND, Edema in Lower Extremities, Anasarca, Dizziness, Palpitations  Resp: +Dyspnea, No DOE, No Productive Cough, No Non-Productive Cough, No Hemoptysis, No Wheezing.    GI: No Heartburn, Indigestion, Abdominal Pain, +Nausea, Vomiting, Diarrhea, Constipation, Hematemesis, Hematochezia, Melena, Change in Bowel Habits,  Loss of Appetite  GU: No Dysuria, No Change in Color of Urine, No Urgency or Urinary Frequency, No Flank pain.  Musculoskeletal: No Joint Pain or Swelling, No Decreased Range of Motion, No Back Pain.  Neurologic: No Syncope, No Seizures, Muscle Weakness, Paresthesia, Vision Disturbance or Loss, No Diplopia, No Vertigo, No Difficulty Walking,  Skin: No Rash or Lesions. Psych: No Change in  Mood or Affect, No Depression or Anxiety, No Memory loss, No Confusion, or Hallucinations   Past Medical History  Diagnosis Date  . Diabetes mellitus   . Hypertension   . High cholesterol   . GERD (gastroesophageal reflux disease)   . H/O hiatal hernia   . MI (myocardial infarction) (HCC) 2015  . Hypothyroidism   . History of kidney stones   . Neuromuscular disorder (HCC)     neuropathy; carpal tunnel BOTH WRISTS     Past Surgical History  Procedure Laterality Date  . Cholecystectomy    . Cesarean section      X 3  . Eus  11/18/2011    Dr. Dulce Sellar: gastric nodule, suspect benign leiomyoma or focal thicekning of muscularis propria with extension to the pyloric channel. Consider repeat EUS in 1-2 years.   . Colonoscopy with esophagogastroduodenoscopy (egd)      Dr. Randa Evens.   . Coronary angioplasty with stent placement  2015    STENT X 1  . Uterine ablation    . Esophagogastroduodenoscopy N/A 04/11/2015    Dr. Outlaw:Gastric nodule   . Eus N/A 04/11/2015    Dr. Dulce Sellar: 10X40mm nodule, no significant change in size since last EUS Sept 2013. no further surveillance needed   . Colonoscopy  April 2016    Dr. Teena Dunk: diverticulum in ascending colon, small internal hemorrhoids,   . Esophagogastroduodenoscopy  2013    Dr. Randa Evens: normal esophagus. medium-sized submucosal mass with no bleeding and no stigmata of recent bleeding, s/p biopsy. path: benign small bowel mucosa, surgical polyp with polypoid reactive-type gastropathy, negative  h.pylori   . Colonoscopy  2013    Dr. Randa Evens: normal mucosa, internal hemorrhoids. path: benign colonic mucosa      Prior to Admission medications   Medication Sig Start Date End Date Taking? Authorizing Provider  acetaminophen (TYLENOL) 500 MG tablet Take 500 mg by mouth every 6 (six) hours as needed (Pain).   Yes Historical Provider, MD  aspirin EC 81 MG tablet Take 81 mg by mouth daily.   Yes Historical Provider, MD  atorvastatin (LIPITOR) 40 MG  tablet Take 40 mg by mouth every evening.   Yes Historical Provider, MD  clopidogrel (PLAVIX) 75 MG tablet Take 75 mg by mouth daily. Reported on 02/28/2015   Yes Historical Provider, MD  furosemide (LASIX) 20 MG tablet Take 20 mg by mouth daily.   Yes Historical Provider, MD  gabapentin (NEURONTIN) 300 MG capsule Take 300 mg by mouth 2 (two) times daily.   Yes Historical Provider, MD  Insulin Aspart (NOVOLOG [place]) Inject 0-16 Units into the skin 3 (three) times daily as needed (Using according to sliding scale at home.). Sliding  scale   Yes Historical Provider, MD  insulin detemir (LEVEMIR) 100 UNIT/ML injection Inject 60 Units into the skin at bedtime.   Yes Historical Provider, MD  levothyroxine (SYNTHROID, LEVOTHROID) 75 MCG tablet Take 75 mcg by mouth daily before breakfast. Reported on 04/23/2015   Yes Historical Provider, MD  losartan (COZAAR) 50 MG tablet Take 50 mg by mouth daily.   Yes Historical Provider, MD  lubiprostone (AMITIZA) 24 MCG capsule Take 1 capsule (24 mcg total) by mouth 2 (two) times daily with a meal. 02/28/15  Yes Nira Retort, NP  metFORMIN (GLUCOPHAGE) 1000 MG tablet Take 1,000 mg by mouth daily with breakfast.   Yes Historical Provider, MD  naloxegol oxalate (MOVANTIK) 25 MG TABS tablet Take 1 tablet (25 mg total) by mouth daily. 1 hour before breakfast 04/23/15  Yes Nira Retort, NP  omeprazole (PRILOSEC) 40 MG capsule Take 40 mg by mouth daily.   Yes Historical Provider, MD  Oxycodone HCl 10 MG TABS Take 10 mg by mouth every 4 (four) hours as needed (Pain).    Yes Historical Provider, MD     No Known Allergies  Social History:  reports that she has been smoking Cigarettes.  She has been smoking about 1.00 pack per day. She has never used smokeless tobacco. She reports that she does not drink alcohol or use illicit drugs.    Family History  Problem Relation Age of Onset  . Colon cancer Neg Hx        Physical Exam:  GEN:  Pleasant Obese 50 y.o. Caucasian female  examined and in no acute distress; cooperative with exam Filed Vitals:   05/06/15 2233 05/06/15 2235 05/06/15 2330 05/07/15 0000  BP: 105/81 105/81 101/58 106/69  Pulse: 83 80 75 82  Resp: 14 17 21 15   SpO2: 96% 97% 96% 99%   Blood pressure 106/69, pulse 82, resp. rate 15, last menstrual period 06/09/2014, SpO2 99 %. PSYCH: She is alert and oriented x4; does not appear anxious does not appear depressed; affect is normal HEENT: Normocephalic and Atraumatic, Mucous membranes pink; PERRLA; EOM intact; Fundi:  Benign;  No scleral icterus, Nares: Patent, Oropharynx: Clear, Fair Dentition,    Neck:  FROM, No Cervical Lymphadenopathy nor Thyromegaly or Carotid Bruit; No JVD; Breasts:: Not examined CHEST WALL: No tenderness CHEST: Normal respiration, clear to auscultation bilaterally HEART: Regular rate and rhythm; no murmurs rubs  or gallops BACK: No kyphosis or scoliosis; No CVA tenderness ABDOMEN: Positive Bowel Sounds, Obese, Soft Non-Tender, No Rebound or Guarding; No Masses, No Organomegaly Rectal Exam: Not done EXTREMITIES: No Cyanosis, Clubbing, or Edema; No Ulcerations. Genitalia: not examined PULSES: 2+ and symmetric SKIN: Normal hydration no rash or ulceration CNS:  Alert and Oriented x 4, No Focal Deficits Vascular: pulses palpable throughout    Labs on Admission:  Basic Metabolic Panel:  Recent Labs Lab 05/06/15 2201  NA 131*  K 3.9  CL 98*  CO2 23  GLUCOSE 204*  BUN 14  CREATININE 0.62  CALCIUM 7.9*   Liver Function Tests: No results for input(s): AST, ALT, ALKPHOS, BILITOT, PROT, ALBUMIN in the last 168 hours. No results for input(s): LIPASE, AMYLASE in the last 168 hours. No results for input(s): AMMONIA in the last 168 hours. CBC:  Recent Labs Lab 05/06/15 2201  WBC 10.7*  NEUTROABS 9.6*  HGB 13.5  HCT 40.9  MCV 90.1  PLT 200   Cardiac Enzymes:  Recent Labs Lab 05/06/15 2201  TROPONINI <0.03    BNP (last 3 results) No results for input(s):  BNP in the last 8760 hours.  ProBNP (last 3 results) No results for input(s): PROBNP in the last 8760 hours.  CBG: No results for input(s): GLUCAP in the last 168 hours.  Radiological Exams on Admission: Dg Chest 2 View  05/06/2015  CLINICAL DATA:  Acute onset of generalized chest discomfort and nausea. Initial encounter. EXAM: CHEST  2 VIEW COMPARISON:  Chest radiograph performed 06/27/2014, and CTA of the chest performed 12/04/2014 FINDINGS: The lungs are well-aerated and clear. There is no evidence of focal opacification, pleural effusion or pneumothorax. The heart is normal in size; the mediastinal contour is within normal limits. No acute osseous abnormalities are seen. Clips are noted within the right upper quadrant, reflecting prior cholecystectomy. IMPRESSION: No acute cardiopulmonary process seen. Electronically Signed   By: Roanna Raider M.D.   On: 05/06/2015 23:38     EKG: Independently reviewed. Normal sinus Rhythm Rate =   No Acute S-T Changes   Assessment/Plan:      50 y.o. female with  Principal Problem:    1.    Chest pain    [place] Monitoring    Cycle Troponins    Nitropaste, O2 ASA     Continue Plavix, and Lipitor Rx    Check Fasting Lipids in AM    2D ECHO in AM   Active Problems:    2.    Diabetes mellitus    Continue Levemir Rx    Hold Metformin Rx    SSI Coverage PRN      3.    Hypertension    Continue Lasix, and Losartan Rx    Monitor BPs      4.    High cholesterol    Continue Lipitor    Check Fasting Lipids in AM      5.    Hypothyroidism    Continue Levothyroxine Rx      6.    DVT Prophylaxis    Lovenox         Code Status:     FULL CODE      Family Communication:   Husband and Daughter at Bedside   Disposition Plan:     Observation Status with Expected LOS 1-2 days       Time spent:  65 Minutes      Jaynee Winters C [facility] Pager [phone]  If 7AM -7PM Please Contact the Day Rounding Team MD for [facility]  If 7PM-7AM, Please Contact Night-Floor Coverage  [email or web address] Password [email or web address] 05/07/2015, 12:17 AM     ADDENDUM:   Patient was seen and examined on 05/07/2015

## 2015-05-07 NOTE — Discharge Summary (Addendum)
Physician Discharge Summary  [name] MRN: [id number] DOB/AGE: [birth year] 50 y.o.  PCP: Octavio Graves, DO   Admit date: 05/06/2015 Discharge date: 05/07/2015  Discharge Diagnoses:     Principal Problem:   Chest pain Active Problems:   Diabetes mellitus   Hypertension   High cholesterol   Hypothyroidism    Follow-up recommendations Follow-up with PCP in 3-5 days , including all  additional recommended appointments as below Follow-up CBC, CMP in 3-5 days Cardiology recommends to follow-up with outpatient cardiology for an outpatient stress test in the next 1-2 weeks      Discharge Condition: Stable   Discharge Instructions    Current Discharge Medication List    START taking these medications   Details  albuterol (PROVENTIL HFA;VENTOLIN HFA) 108 (90 Base) MCG/ACT inhaler Inhale 2 puffs into the lungs every 6 (six) hours as needed for wheezing or shortness of breath. Qty: 1 Inhaler, Refills: 2    azithromycin (ZITHROMAX) 500 MG tablet Take 1 tablet (500 mg total) by mouth daily. Qty: 5 tablet, Refills: 0    nicotine (NICODERM CQ - DOSED IN MG/24 HOURS) 14 mg/24hr patch Place 1 patch (14 mg total) onto the skin daily. Qty: 28 patch, Refills: 0      CONTINUE these medications which have NOT CHANGED   Details  acetaminophen (TYLENOL) 500 MG tablet Take 500 mg by mouth every 6 (six) hours as needed (Pain).    aspirin EC 81 MG tablet Take 81 mg by mouth daily.    atorvastatin (LIPITOR) 40 MG tablet Take 40 mg by mouth every evening.    clopidogrel (PLAVIX) 75 MG tablet Take 75 mg by mouth daily. Reported on 02/28/2015    furosemide (LASIX) 20 MG tablet Take 20 mg by mouth daily.    gabapentin (NEURONTIN) 300 MG capsule Take 300 mg by mouth 2 (two) times daily.    Insulin Aspart (NOVOLOG [place]) Inject 0-16 Units into the skin 3 (three) times daily as needed (Using according to sliding scale at home.). Sliding  scale    insulin detemir (LEVEMIR) 100  UNIT/ML injection Inject 60 Units into the skin at bedtime.    levothyroxine (SYNTHROID, LEVOTHROID) 75 MCG tablet Take 75 mcg by mouth daily before breakfast. Reported on 04/23/2015    losartan (COZAAR) 50 MG tablet Take 50 mg by mouth daily.    lubiprostone (AMITIZA) 24 MCG capsule Take 1 capsule (24 mcg total) by mouth 2 (two) times daily with a meal. Qty: 60 capsule, Refills: 3    metFORMIN (GLUCOPHAGE) 1000 MG tablet Take 1,000 mg by mouth daily with breakfast.    naloxegol oxalate (MOVANTIK) 25 MG TABS tablet Take 1 tablet (25 mg total) by mouth daily. 1 hour before breakfast Qty: 30 tablet, Refills: 3    omeprazole (PRILOSEC) 40 MG capsule Take 40 mg by mouth daily.    Oxycodone HCl 10 MG TABS Take 10 mg by mouth every 4 (four) hours as needed (Pain).        No Known Allergies    Disposition: 01-Home or Self Care   Consults:  Telephone consultation with Cardiology Dr. Domenic Polite     Significant Diagnostic Studies:  Dg Chest 2 View  05/06/2015  CLINICAL DATA:  Acute onset of generalized chest discomfort and nausea. Initial encounter. EXAM: CHEST  2 VIEW COMPARISON:  Chest radiograph performed 06/27/2014, and CTA of the chest performed 12/04/2014 FINDINGS: The lungs are well-aerated and clear. There is no evidence of focal opacification, pleural effusion or pneumothorax.  The heart is normal in size; the mediastinal contour is within normal limits. No acute osseous abnormalities are seen. Clips are noted within the right upper quadrant, reflecting prior cholecystectomy. IMPRESSION: No acute cardiopulmonary process seen. Electronically Signed   By: Garald Balding M.D.   On: 05/06/2015 23:38   Nm Gastric Emptying  04/25/2015  CLINICAL DATA:  Three year history of nausea without vomiting, bloating, abdominal pain, acid reflux, early satiety, diabetes mellitus, hypertension, smoker EXAM: NUCLEAR MEDICINE GASTRIC EMPTYING SCAN TECHNIQUE: After oral ingestion of radiolabeled meal,  sequential abdominal images were obtained for 120 minutes. Residual percentage of activity remaining within the stomach was calculated at 60 and 120 minutes. RADIOPHARMACEUTICALS:  2.1 mCi Tc-29mlabeled sulfur colloid orally COMPARISON:  None FINDINGS: Expected position of stomach in LEFT upper quadrant. Rapid emptying of tracer from the stomach. At 1 hour, minimal residual tracer in gastric antrum with majority of tracer within small bowel. A 2 hours, no significant residual gastric tracer identified. Quantitative analysis reveals 87% emptying at 1 hour and 91% at 2 hours. IMPRESSION: Accelerated gastric emptying with 87% of ingested tracer emptied from the stomach at 1 hour. Question of dumping is raised. Electronically Signed   By: MLavonia DanaM.D.   On: 04/25/2015 10:42     Blood Culture    Component Value Date/Time   SDES URINE, CLEAN CATCH 03/15/2011 0033   SPECREQUEST NONE 03/15/2011 0033   CULT NO GROWTH 03/15/2011 0033   REPTSTATUS 03/16/2011 FINAL 03/15/2011 0033      Labs: Results for orders placed or performed during the hospital encounter of 05/06/15 (from the past 48 hour(s))  Basic metabolic panel     Status: Abnormal   Collection Time: 05/06/15 10:01 PM  Result Value Ref Range   Sodium 131 (L) 135 - 145 mmol/L   Potassium 3.9 3.5 - 5.1 mmol/L   Chloride 98 (L) 101 - 111 mmol/L   CO2 23 22 - 32 mmol/L   Glucose, Bld 204 (H) 65 - 99 mg/dL   BUN 14 6 - 20 mg/dL   Creatinine, Ser 0.62 0.44 - 1.00 mg/dL   Calcium 7.9 (L) 8.9 - 10.3 mg/dL   GFR calc non Af Amer >60 >60 mL/min   GFR calc Af Amer >60 >60 mL/min    Comment: (NOTE) The eGFR has been calculated using the CKD EPI equation. This calculation has not been validated in all clinical situations. eGFR's persistently <60 mL/min signify possible Chronic Kidney Disease.    Anion gap 10 5 - 15  Troponin I     Status: None   Collection Time: 05/06/15 10:01 PM  Result Value Ref Range   Troponin I <0.03 <0.031 ng/mL     Comment:        NO INDICATION OF MYOCARDIAL INJURY.   CBC with Differential     Status: Abnormal   Collection Time: 05/06/15 10:01 PM  Result Value Ref Range   WBC 10.7 (H) 4.0 - 10.5 K/uL   RBC 4.54 3.87 - 5.11 MIL/uL   Hemoglobin 13.5 12.0 - 15.0 g/dL   HCT 40.9 36.0 - 46.0 %   MCV 90.1 78.0 - 100.0 fL   MCH 29.7 26.0 - 34.0 pg   MCHC 33.0 30.0 - 36.0 g/dL   RDW 15.0 11.5 - 15.5 %   Platelets 200 150 - 400 K/uL   Neutrophils Relative % 89 %   Neutro Abs 9.6 (H) 1.7 - 7.7 K/uL   Lymphocytes Relative 7 %  Lymphs Abs 0.8 0.7 - 4.0 K/uL   Monocytes Relative 3 %   Monocytes Absolute 0.3 0.1 - 1.0 K/uL   Eosinophils Relative 1 %   Eosinophils Absolute 0.1 0.0 - 0.7 K/uL   Basophils Relative 0 %   Basophils Absolute 0.0 0.0 - 0.1 K/uL  CBG monitoring, ED     Status: Abnormal   Collection Time: 05/07/15  1:36 AM  Result Value Ref Range   Glucose-Capillary 129 (H) 65 - 99 mg/dL  Troponin I     Status: None   Collection Time: 05/07/15  2:00 AM  Result Value Ref Range   Troponin I <0.03 <0.031 ng/mL    Comment:        NO INDICATION OF MYOCARDIAL INJURY.   Lipid panel     Status: Abnormal   Collection Time: 05/07/15  5:18 AM  Result Value Ref Range   Cholesterol 138 0 - 200 mg/dL   Triglycerides 348 (H) <150 mg/dL   HDL 24 (L) >40 mg/dL   Total CHOL/HDL Ratio 5.8 RATIO   VLDL 70 (H) 0 - 40 mg/dL   LDL Cholesterol 44 0 - 99 mg/dL    Comment:        Total Cholesterol/HDL:CHD Risk Coronary Heart Disease Risk Table                     Men   Women  1/2 Average Risk   3.4   3.3  Average Risk       5.0   4.4  2 X Average Risk   9.6   7.1  3 X Average Risk  23.4   11.0        Use the calculated Patient Ratio above and the CHD Risk Table to determine the patient's CHD Risk.        ATP III CLASSIFICATION (LDL):  <100     mg/dL   Optimal  100-129  mg/dL   Near or Above                    Optimal  130-159  mg/dL   Borderline  160-189  mg/dL   High  >190     mg/dL   Very  High   Basic metabolic panel     Status: Abnormal   Collection Time: 05/07/15  5:18 AM  Result Value Ref Range   Sodium 135 135 - 145 mmol/L   Potassium 3.7 3.5 - 5.1 mmol/L   Chloride 104 101 - 111 mmol/L   CO2 23 22 - 32 mmol/L   Glucose, Bld 163 (H) 65 - 99 mg/dL   BUN 13 6 - 20 mg/dL   Creatinine, Ser 0.56 0.44 - 1.00 mg/dL   Calcium 7.1 (L) 8.9 - 10.3 mg/dL   GFR calc non Af Amer >60 >60 mL/min   GFR calc Af Amer >60 >60 mL/min    Comment: (NOTE) The eGFR has been calculated using the CKD EPI equation. This calculation has not been validated in all clinical situations. eGFR's persistently <60 mL/min signify possible Chronic Kidney Disease.    Anion gap 8 5 - 15  CBC     Status: Abnormal   Collection Time: 05/07/15  5:18 AM  Result Value Ref Range   WBC 7.5 4.0 - 10.5 K/uL   RBC 3.88 3.87 - 5.11 MIL/uL   Hemoglobin 11.8 (L) 12.0 - 15.0 g/dL   HCT 35.2 (L) 36.0 - 46.0 %   MCV 90.7 78.0 -  100.0 fL   MCH 30.4 26.0 - 34.0 pg   MCHC 33.5 30.0 - 36.0 g/dL   RDW 15.1 11.5 - 15.5 %   Platelets 186 150 - 400 K/uL  CBG monitoring, ED     Status: Abnormal   Collection Time: 05/07/15  5:23 AM  Result Value Ref Range   Glucose-Capillary 160 (H) 65 - 99 mg/dL  Urinalysis, Routine w reflex microscopic (not at [facility])     Status: None   Collection Time: 05/07/15  6:35 AM  Result Value Ref Range   Color, Urine YELLOW YELLOW   APPearance CLEAR CLEAR   Specific Gravity, Urine 1.025 1.005 - 1.030   pH 5.5 5.0 - 8.0   Glucose, UA NEGATIVE NEGATIVE mg/dL   Hgb urine dipstick NEGATIVE NEGATIVE   Bilirubin Urine NEGATIVE NEGATIVE   Ketones, ur NEGATIVE NEGATIVE mg/dL   Protein, ur NEGATIVE NEGATIVE mg/dL   Nitrite NEGATIVE NEGATIVE   Leukocytes, UA NEGATIVE NEGATIVE    Comment: MICROSCOPIC NOT DONE ON URINES WITH NEGATIVE PROTEIN, BLOOD, LEUKOCYTES, NITRITE, OR GLUCOSE <1000 mg/dL.  CBG monitoring, ED     Status: Abnormal   Collection Time: 05/07/15  7:13 AM  Result Value Ref Range    Glucose-Capillary 142 (H) 65 - 99 mg/dL  Troponin I     Status: None   Collection Time: 05/07/15  7:35 AM  Result Value Ref Range   Troponin I <0.03 <0.031 ng/mL    Comment:        NO INDICATION OF MYOCARDIAL INJURY.      Lipid Panel     Component Value Date/Time   CHOL 138 05/07/2015 0518   TRIG 348* 05/07/2015 0518   HDL 24* 05/07/2015 0518   CHOLHDL 5.8 05/07/2015 0518   VLDL 70* 05/07/2015 0518   LDLCALC 44 05/07/2015 0518     No results found for: HGBA1C   Lab Results  Component Value Date   LDLCALC 44 05/07/2015   CREATININE 0.56 05/07/2015     HPI : 50 year old female with a history of hypertension, dyslipidemia, diabetes, coronary artery disease status post non-ST elevation MI status post drug eluting stent to left circumflex on 12/24/13 followed by cardiology at [facility], who presents to the ER with 2 days of chest pain associated with nausea, neck pain radiating into the chest, nonexertional not relieved by nitroglycerin.   HOSPITAL COURSE:  Chest pain-atypical likely in the setting of viral bronchitis Patient continues to smoke and has had fever and chills for the last 2 days associated with neck pain and myalgias Cardiac enzymes negative 3 Checked d-dimer secondary to recent carpal tunnel surgery of the right wrist, and hence the patient has not been very active. CT angio negative for pulmonary embolism, venous Doppler negative Patient discussed with Dr. Domenic Polite Patient is fairly atypical features and will be provided with azithromycin for acute bronchitis However she does need to follow-up as soon as possible with her outpatient cardiologist for a stress test EKG shows normal sinus rhythm  Diabetes mellitus Continue Levemir, metformin  Discharge Exam:  Blood pressure 110/63, pulse 70, resp. rate 15, last menstrual period 06/09/2014, SpO2 96 %. HEART: Regular rate and rhythm; no murmurs rubs or gallops BACK: No kyphosis or scoliosis;  No CVA tenderness ABDOMEN: Positive Bowel Sounds, Obese, Soft Non-Tender, No Rebound or Guarding; No Masses, No Organomegaly Rectal Exam: Not done EXTREMITIES: No Cyanosis, Clubbing, or Edema; No Ulcerations. Genitalia: not examined PULSES: 2+ and symmetric SKIN: Normal hydration no rash or  ulceration CNS: Alert and Oriented x 4, No Focal Deficits         Follow-up Information    Follow up with Vasu,sujethra MD . Schedule an appointment as soon as possible for a visit in 1 week.   Contact information:   Please contact cardiology to make an appointment in the next 1-2 weeks for a stress test      Follow up with [place], DO. Schedule an appointment as soon as possible for a visit in 3 days.   Contact information:   [address] [place] [id number] [phone]       Signed: Reyne Dumas 05/07/2015, 9:32 AM        Time spent >45 mins

## 2015-05-07 NOTE — ED Notes (Signed)
Spoke with Jessica(case management) earlier as a consult per Jeanella Anton, Resident Hospitalist.

## 2015-05-07 NOTE — ED Notes (Signed)
MD Lovell Sheehan made aware of pt's BP falling below 100 systolic after NS bolus.

## 2015-05-07 NOTE — ED Notes (Signed)
Patient with no complaints at this time. Respirations even and unlabored. Skin warm/dry. Discharge instructions reviewed with patient at this time. Patient given opportunity to voice concerns/ask questions. IV removed per policy and band-aid applied to site. Patient discharged at this time and left Emergency Department with steady gait.  

## 2015-05-08 ENCOUNTER — Ambulatory Visit (HOSPITAL_COMMUNITY)
Admission: RE | Admit: 2015-05-08 | Discharge: 2015-05-08 | Disposition: A | Discharge status: Home / Self Care | Payer: BLUE CROSS/BLUE SHIELD | Source: Ambulatory Visit | Attending: Gastroenterology | Admitting: Gastroenterology

## 2015-05-08 DIAGNOSIS — R109 Unspecified abdominal pain: Secondary | ICD-10-CM | POA: Diagnosis present

## 2015-05-08 DIAGNOSIS — I700 Atherosclerosis of aorta: Secondary | ICD-10-CM | POA: Diagnosis not present

## 2015-05-08 DIAGNOSIS — K439 Ventral hernia without obstruction or gangrene: Secondary | ICD-10-CM | POA: Diagnosis not present

## 2015-05-08 LAB — HEMOGLOBIN A1C
Hgb A1c MFr Bld: 8 % — ABNORMAL HIGH (ref 4.8–5.6)
Mean Plasma Glucose: 183 mg/dL

## 2015-05-08 LAB — POCT I-STAT CREATININE: CREATININE: 0.7 mg/dL (ref 0.44–1.00)

## 2015-05-08 MED ORDER — IOHEXOL 350 MG/ML SOLN
100.0000 mL | Freq: Once | INTRAVENOUS | Status: AC | PRN
  Administered 2015-05-08: 100 mL via INTRAVENOUS

## 2015-05-14 ENCOUNTER — Telehealth: Payer: Self-pay | Admitting: Internal Medicine

## 2015-05-14 NOTE — Progress Notes (Signed)
Quick Note:  SMA patent, IMA patent, celiac patent. No evidence for mesenteric ischemia. She has an upper abdominal ventral hernia. This could be causing her discomfort. Please find out if she prefers [place] or [place] for Gen Surg evaluation and refer to where she desires. ______

## 2015-05-14 NOTE — Telephone Encounter (Signed)
Routing to AS 

## 2015-05-14 NOTE — Telephone Encounter (Signed)
Pt had CT scan done a week or so ago and was calling for her results. [phone]

## 2015-05-14 NOTE — Telephone Encounter (Signed)
See result note.  

## 2015-05-15 ENCOUNTER — Other Ambulatory Visit: Payer: Self-pay

## 2015-05-15 DIAGNOSIS — K458 Other specified abdominal hernia without obstruction or gangrene: Secondary | ICD-10-CM

## 2015-05-30 ENCOUNTER — Ambulatory Visit (INDEPENDENT_AMBULATORY_CARE_PROVIDER_SITE_OTHER): Payer: BLUE CROSS/BLUE SHIELD | Admitting: Gastroenterology

## 2015-05-30 ENCOUNTER — Encounter: Payer: Self-pay | Admitting: Gastroenterology

## 2015-05-30 VITALS — BP 138/81 | HR 70 | Temp 98.1°F | Ht 59.0 in | Wt 187.0 lb

## 2015-05-30 DIAGNOSIS — K5900 Constipation, unspecified: Secondary | ICD-10-CM

## 2015-05-30 DIAGNOSIS — K458 Other specified abdominal hernia without obstruction or gangrene: Secondary | ICD-10-CM

## 2015-05-30 DIAGNOSIS — K469 Unspecified abdominal hernia without obstruction or gangrene: Secondary | ICD-10-CM | POA: Insufficient documentation

## 2015-05-30 NOTE — Assessment & Plan Note (Signed)
Doing well on Movantik 25 mg in setting of opioid-induced constipation. Continue current regimen. 4 month return.

## 2015-05-30 NOTE — Assessment & Plan Note (Signed)
Noted on CTA. Likely culprit for persistent symptoms. She has been thoroughly evaluated as noted in HPI. Upcoming appt with Dr. Lovell Sheehan on 3/23. Return in 4 months.

## 2015-05-30 NOTE — Patient Instructions (Signed)
Continue Movantik once daily.  I will see you back in 4 months. I hope it goes well tomorrow!

## 2015-05-30 NOTE — Progress Notes (Signed)
Referring Provider: Samuel Jester, DO Primary Care Physician:  Samuel Jester, DO  Primary GI: Dr. Lovell Sheehan   Chief Complaint  Patient presents with  . Follow-up    HPI:   [name] is a 50 y.o. female presenting today with a history of bloating, nausea, constipation. Colonoscopy up-to-date as of last year, recent EUS completed due to history of gastric nodule. This remains essentially unchanged. No need for further follow-up/surveillance of this. GES Feb 2017 with accelerated gastric emptying. CTA negative for mesenteric ischemia but noted an upper abdominal ventral hernia. She has been referred to see Dr. Lovell Sheehan.   Last night had significant upper abdominal swelling. Got scared. Sees Dr. Lovell Sheehan 3/23. Has been doing a lot of pushing/pulling with dad while he was inpatient. Pain at times feels constant. No vomiting. Just feels nauseated. Feels like she can't eat in the morning. Prilosec 40 mg daily. Movantik 25 mg daily, doing well.   Past Medical History  Diagnosis Date  . Diabetes mellitus   . Hypertension   . High cholesterol   . GERD (gastroesophageal reflux disease)   . H/O hiatal hernia   . MI (myocardial infarction) (HCC) 2015  . Hypothyroidism   . History of kidney stones   . Neuromuscular disorder (HCC)     neuropathy; carpal tunnel BOTH WRISTS    Past Surgical History  Procedure Laterality Date  . Cholecystectomy    . Cesarean section      X 3  . Eus  11/18/2011    Dr. Dulce Sellar: gastric nodule, suspect benign leiomyoma or focal thicekning of muscularis propria with extension to the pyloric channel. Consider repeat EUS in 1-2 years.   . Colonoscopy with esophagogastroduodenoscopy (egd)      Dr. Randa Evens.   . Coronary angioplasty with stent placement  2015    STENT X 1  . Uterine ablation    . Esophagogastroduodenoscopy N/A 04/11/2015    Dr. Outlaw:Gastric nodule   . Eus N/A 04/11/2015    Dr. Dulce Sellar: 10X52mm nodule, no significant change in size since last EUS  Sept 2013. no further surveillance needed   . Colonoscopy  April 2016    Dr. Teena Dunk: diverticulum in ascending colon, small internal hemorrhoids,   . Esophagogastroduodenoscopy  2013    Dr. Randa Evens: normal esophagus. medium-sized submucosal mass with no bleeding and no stigmata of recent bleeding, s/p biopsy. path: benign small bowel mucosa, surgical polyp with polypoid reactive-type gastropathy, negative h.pylori   . Colonoscopy  2013    Dr. Randa Evens: normal mucosa, internal hemorrhoids. path: benign colonic mucosa    Current Outpatient Prescriptions  Medication Sig Dispense Refill  . acetaminophen (TYLENOL) 500 MG tablet Take 500 mg by mouth every 6 (six) hours as needed (Pain).    Marland Kitchen albuterol (PROVENTIL HFA;VENTOLIN HFA) 108 (90 Base) MCG/ACT inhaler Inhale 2 puffs into the lungs every 6 (six) hours as needed for wheezing or shortness of breath. 1 Inhaler 2  . aspirin EC 81 MG tablet Take 81 mg by mouth daily.    Marland Kitchen atorvastatin (LIPITOR) 40 MG tablet Take 40 mg by mouth every evening.    Marland Kitchen azithromycin (ZITHROMAX) 500 MG tablet Take 1 tablet (500 mg total) by mouth daily. 5 tablet 0  . clopidogrel (PLAVIX) 75 MG tablet Take 75 mg by mouth daily. Reported on 02/28/2015    . furosemide (LASIX) 20 MG tablet Take 20 mg by mouth daily.    Marland Kitchen gabapentin (NEURONTIN) 300 MG capsule Take 300 mg by mouth  2 (two) times daily.    . Insulin Aspart (NOVOLOG [place]) Inject 0-16 Units into the skin 3 (three) times daily as needed (Using according to sliding scale at home.). Sliding  scale    . insulin detemir (LEVEMIR) 100 UNIT/ML injection Inject 60 Units into the skin at bedtime.    Marland Kitchen levothyroxine (SYNTHROID, LEVOTHROID) 75 MCG tablet Take 75 mcg by mouth daily before breakfast. Reported on 04/23/2015    . losartan (COZAAR) 50 MG tablet Take 50 mg by mouth daily.    . metFORMIN (GLUCOPHAGE) 1000 MG tablet Take 1,000 mg by mouth daily with breakfast.    . naloxegol oxalate (MOVANTIK) 25 MG TABS tablet Take 1  tablet (25 mg total) by mouth daily. 1 hour before breakfast 30 tablet 3  . nicotine (NICODERM CQ - DOSED IN MG/24 HOURS) 14 mg/24hr patch Place 1 patch (14 mg total) onto the skin daily. 28 patch 0  . omeprazole (PRILOSEC) 40 MG capsule Take 40 mg by mouth daily.    . Oxycodone HCl 10 MG TABS Take 10 mg by mouth every 4 (four) hours as needed (Pain).      No current facility-administered medications for this visit.    Allergies as of 05/30/2015  . (No Known Allergies)    Family History  Problem Relation Age of Onset  . Colon cancer Neg Hx     Social History   Social History  . Marital Status: Married    Spouse Name: N/A  . Number of Children: N/A  . Years of Education: N/A   Social History Main Topics  . Smoking status: Current Every Day Smoker -- 1.00 packs/day    Types: Cigarettes  . Smokeless tobacco: Never Used  . Alcohol Use: No  . Drug Use: No  . Sexual Activity: Not Asked   Other Topics Concern  . None   Social History Narrative    Review of Systems: Negative unless mentioned in HPI   Physical Exam: BP 138/81 mmHg  Pulse 70  Temp(Src) 98.1 F (36.7 C) (Oral)  Ht 4\' 11"  (1.499 m)  Wt 187 lb (84.823 kg)  BMI 37.75 kg/m2 General:   Alert and oriented. No distress noted. Pleasant and cooperative.  Head:  Normocephalic and atraumatic. Eyes:  Conjuctiva clear without scleral icterus. Mouth:  Oral mucosa pink and moist. Good dentition. No lesions. Abdomen:  +BS, soft, TTP upper abdomen, larger AP diameter, obese. Ventral hernia with vasalva Msk:  Symmetrical without gross deformities. Normal posture. Extremities:  Without edema. Neurologic:  Alert and  oriented x4;  grossly normal neurologically. Psych:  Alert and cooperative. Normal mood and affect.

## 2015-05-30 NOTE — Progress Notes (Signed)
CC'D TO PCP °

## 2015-05-30 NOTE — Progress Notes (Signed)
Referring Provider: Samuel Jester, DO Primary Care Physician:  Samuel Jester, DO  Chief Complaint  Patient presents with  . Follow-up    HPI:   [name] is a 50 y.o. female presenting today with a history of   Past Medical History  Diagnosis Date  . Diabetes mellitus   . Hypertension   . High cholesterol   . GERD (gastroesophageal reflux disease)   . H/O hiatal hernia   . MI (myocardial infarction) (HCC) 2015  . Hypothyroidism   . History of kidney stones   . Neuromuscular disorder (HCC)     neuropathy; carpal tunnel BOTH WRISTS    Past Surgical History  Procedure Laterality Date  . Cholecystectomy    . Cesarean section      X 3  . Eus  11/18/2011    Dr. Dulce Sellar: gastric nodule, suspect benign leiomyoma or focal thicekning of muscularis propria with extension to the pyloric channel. Consider repeat EUS in 1-2 years.   . Colonoscopy with esophagogastroduodenoscopy (egd)      Dr. Randa Evens.   . Coronary angioplasty with stent placement  2015    STENT X 1  . Uterine ablation    . Esophagogastroduodenoscopy N/A 04/11/2015    Dr. Outlaw:Gastric nodule   . Eus N/A 04/11/2015    Dr. Dulce Sellar: 10X33mm nodule, no significant change in size since last EUS Sept 2013. no further surveillance needed   . Colonoscopy  April 2016    Dr. Teena Dunk: diverticulum in ascending colon, small internal hemorrhoids,   . Esophagogastroduodenoscopy  2013    Dr. Randa Evens: normal esophagus. medium-sized submucosal mass with no bleeding and no stigmata of recent bleeding, s/p biopsy. path: benign small bowel mucosa, surgical polyp with polypoid reactive-type gastropathy, negative h.pylori   . Colonoscopy  2013    Dr. Randa Evens: normal mucosa, internal hemorrhoids. path: benign colonic mucosa    Current Outpatient Prescriptions  Medication Sig Dispense Refill  . acetaminophen (TYLENOL) 500 MG tablet Take 500 mg by mouth every 6 (six) hours as needed (Pain).    Marland Kitchen albuterol (PROVENTIL HFA;VENTOLIN  HFA) 108 (90 Base) MCG/ACT inhaler Inhale 2 puffs into the lungs every 6 (six) hours as needed for wheezing or shortness of breath. 1 Inhaler 2  . aspirin EC 81 MG tablet Take 81 mg by mouth daily.    Marland Kitchen atorvastatin (LIPITOR) 40 MG tablet Take 40 mg by mouth every evening.    Marland Kitchen azithromycin (ZITHROMAX) 500 MG tablet Take 1 tablet (500 mg total) by mouth daily. 5 tablet 0  . clopidogrel (PLAVIX) 75 MG tablet Take 75 mg by mouth daily. Reported on 02/28/2015    . furosemide (LASIX) 20 MG tablet Take 20 mg by mouth daily.    Marland Kitchen gabapentin (NEURONTIN) 300 MG capsule Take 300 mg by mouth 2 (two) times daily.    . Insulin Aspart (NOVOLOG [place]) Inject 0-16 Units into the skin 3 (three) times daily as needed (Using according to sliding scale at home.). Sliding  scale    . insulin detemir (LEVEMIR) 100 UNIT/ML injection Inject 60 Units into the skin at bedtime.    Marland Kitchen levothyroxine (SYNTHROID, LEVOTHROID) 75 MCG tablet Take 75 mcg by mouth daily before breakfast. Reported on 04/23/2015    . losartan (COZAAR) 50 MG tablet Take 50 mg by mouth daily.    Marland Kitchen lubiprostone (AMITIZA) 24 MCG capsule Take 1 capsule (24 mcg total) by mouth 2 (two) times daily with a meal. 60 capsule 3  . metFORMIN (  GLUCOPHAGE) 1000 MG tablet Take 1,000 mg by mouth daily with breakfast.    . naloxegol oxalate (MOVANTIK) 25 MG TABS tablet Take 1 tablet (25 mg total) by mouth daily. 1 hour before breakfast 30 tablet 3  . nicotine (NICODERM CQ - DOSED IN MG/24 HOURS) 14 mg/24hr patch Place 1 patch (14 mg total) onto the skin daily. 28 patch 0  . omeprazole (PRILOSEC) 40 MG capsule Take 40 mg by mouth daily.    . Oxycodone HCl 10 MG TABS Take 10 mg by mouth every 4 (four) hours as needed (Pain).      No current facility-administered medications for this visit.    Allergies as of 05/30/2015  . (No Known Allergies)    Family History  Problem Relation Age of Onset  . Colon cancer Neg Hx     Social History   Social History  . Marital  Status: Married    Spouse Name: N/A  . Number of Children: N/A  . Years of Education: N/A   Social History Main Topics  . Smoking status: Current Every Day Smoker -- 1.00 packs/day    Types: Cigarettes  . Smokeless tobacco: Never Used  . Alcohol Use: No  . Drug Use: No  . Sexual Activity: Not Asked   Other Topics Concern  . None   Social History Narrative    Review of Systems: Gen: Denies fever, chills, anorexia. Denies fatigue, weakness, weight loss.  CV: Denies chest pain, palpitations, syncope, peripheral edema, and claudication. Resp: Denies dyspnea at rest, cough, wheezing, coughing up blood, and pleurisy. GI: Denies vomiting blood, jaundice, and fecal incontinence.   Denies dysphagia or odynophagia. Derm: Denies rash, itching, dry skin Psych: Denies depression, anxiety, memory loss, confusion. No homicidal or suicidal ideation.  Heme: Denies bruising, bleeding, and enlarged lymph nodes.  Physical Exam: BP 138/81 mmHg  Pulse 70  Temp(Src) 98.1 F (36.7 C) (Oral)  Ht 4\' 11"  (1.499 m)  Wt 187 lb (84.823 kg)  BMI 37.75 kg/m2 General:   Alert and oriented. No distress noted. Pleasant and cooperative.  Head:  Normocephalic and atraumatic. Eyes:  Conjuctiva clear without scleral icterus. Mouth:  Oral mucosa pink and moist. Good dentition. No lesions. Neck:  Supple, without mass or thyromegaly. Heart:  S1, S2 present without murmurs, rubs, or gallops. Regular rate and rhythm. Abdomen:  +BS, soft, non-tender and non-distended. No rebound or guarding. No HSM or masses noted. Msk:  Symmetrical without gross deformities. Normal posture. Pulses:  2+ DP noted bilaterally Extremities:  Without edema. Neurologic:  Alert and  oriented x4;  grossly normal neurologically. Skin:  Intact without significant lesions or rashes. Cervical Nodes:  No significant cervical adenopathy. Psych:  Alert and cooperative. Normal mood and affect.

## 2015-06-01 NOTE — H&P (Signed)
  NTS SOAP Note  Vital Signs:  Vitals as of: 05/31/2015: Systolic 135: Diastolic 79: Heart Rate 67: Temp 96.36F (Temporal): Height 59ft 11in: Weight 189Lbs 0 Ounces: Pain Level 4: BMI 38.17   BMI : 38.17 kg/m2  Subjective: This 50 year old female presents for of an incisional hernia.  Has been present for some time now.  Causing increasing swelling and discomfort.  Made worse with straining.  s/p open cholecystectomy in remote past.  Review of Symptoms:  Constitutional:unremarkable   Head:unremarkable Eyes:unremarkable   Nose/Mouth/Throat:unremarkable Cardiovascular:  unremarkable Respiratory:unremarkable Gastrointestinabdominal pain, heartburn, dyspepsia Genitourinary:unremarkable   joint and back pain Skin:unremarkable Hematolgic/Lymphatic:unremarkable   Allergic/Immunologic:unremarkable   Past Medical History:  Reviewed  Past Medical History  Surgical History: cholecystectomy, coronary stent placement Medical Problems: CAD, DM, hypothyroidism, high cholesterol Medications: baby asa, lipitor, plavix, lasix, gabapentin, insulin synthroid, metformin, cozaar, prilosec, jardiance, movantik   Social History:Reviewed  Social History  Preferred Language: English Race:  White Ethnicity: Not Hispanic / Latino Age: 50 year Marital Status:  M Alcohol: n/a   Smoking Status: Light tobacco smoker reviewed on 05/31/2015 Started Date:  Packs per week:  Functional Status reviewed on 05/31/2015 ------------------------------------------------ Bathing: Normal Cooking: Normal Dressing: Normal Driving: Normal Eating: Normal Managing Meds: Normal Oral Care: Normal Shopping: Normal Toileting: Normal Transferring: Normal Walking: Normal Cognitive Status reviewed on 05/31/2015 ------------------------------------------------ Attention: Normal Decision Making: Normal Language: Normal Memory: Normal Motor: Normal Perception: Normal Problem Solving:  Normal Visual and Spatial: Normal   Family History:Reviewed  Family Health History Mother, Living; Diabetes mellitus, unspecified type;  Father, Living; Heart disease;     Objective Information: General:Well appearing, well nourished in no distress. Heart:RRR, no murmur or gallop.  Normal S1, S2.  No S3, S4.  Lungs:  CTA bilaterally, no wheezes, rhonchi, rales.  Breathing unlabored. Abdomen:Soft, NT/ND, no HSM, no masses.  Reducible hernia noted along medial aspect of right subcostal surgical scar, almost at midline  Assessment:Incisional hernia  Diagnoses: 553.21  K43.2 Incisional hernia (Incisional hernia without obstruction or gangrene)  Procedures: 92924 - OFFICE OUTPATIENT NEW 30 MINUTES    Plan:  Scheduled for incisional herniorrhaphy with mesh on 06/08/15.  Patient holding plavix.   Patient Education:Alternative treatments to surgery were discussed with patient (and family).  Risks and benefits  of procedure including bleeding, infection, mesh use, and recurrence of the hernia were fully explained to the patient (and family) who gave informed consent. Patient/family questions were addressed.  Follow-up:Pending Surgery

## 2015-06-04 NOTE — Patient Instructions (Signed)
[name]  06/04/2015     @   Your procedure is scheduled on 06/08/2015.  Report to [facility] at 6:45 A.M.  Call this number if you have problems the morning of surgery:  [phone]   Remember:  Do not eat food or drink liquids after midnight.  Take these medicines the morning of surgery with A SIP OF WATER Albuterol inhaler (bring with you to hospital, as well), gabapentin, synthroid, losartan, oxycodone if needed, movantik  DO NOT TAKE METFORMIN AM OF PROCEDURE  TAKE ONLY 1/2 DOSE OF INSULIN EVENING PRIOR TO PROCEDURE   Do not wear jewelry, make-up or nail polish.  Do not wear lotions, powders, or perfumes.  You may wear deodorant.  Do not shave 48 hours prior to surgery.  Men may shave face and neck.  Do not bring valuables to the hospital.  [facility] is not responsible for any belongings or valuables.  Contacts, dentures or bridgework may not be worn into surgery.  Leave your suitcase in the car.  After surgery it may be brought to your room.  For patients admitted to the hospital, discharge time will be determined by your treatment team.  Patients discharged the day of surgery will not be allowed to drive home.    Please read over the following fact sheets that you were given. Surgical Site Infection Prevention and Anesthesia Post-op Instructions     PATIENT INSTRUCTIONS POST-ANESTHESIA  IMMEDIATELY FOLLOWING SURGERY:  Do not drive or operate machinery for the first twenty four hours after surgery.  Do not make any important decisions for twenty four hours after surgery or while taking narcotic pain medications or sedatives.  If you develop intractable nausea and vomiting or a severe headache please notify your doctor immediately.  FOLLOW-UP:  Please make an appointment with your surgeon as instructed. You do not need to follow up with anesthesia unless specifically instructed to do so.  WOUND CARE INSTRUCTIONS (if applicable):  Keep a dry  clean dressing on the anesthesia/puncture wound site if there is drainage.  Once the wound has quit draining you may leave it open to air.  Generally you should leave the bandage intact for twenty four hours unless there is drainage.  If the epidural site drains for more than 36-48 hours please call the anesthesia department.  QUESTIONS?:  Please feel free to call your physician or the hospital operator if you have any questions, and they will be happy to assist you.      Hernia, Adult A hernia is the bulging of an organ or tissue through a weak spot in the muscles of the abdomen (abdominal wall). Hernias develop most often near the navel or groin. There are many kinds of hernias. Common kinds include:  Femoral hernia. This kind of hernia develops under the groin in the upper thigh area.  Inguinal hernia. This kind of hernia develops in the groin or scrotum.  Umbilical hernia. This kind of hernia develops near the navel.  Hiatal hernia. This kind of hernia causes part of the stomach to be pushed up into the chest.  Incisional hernia. This kind of hernia bulges through a scar from an abdominal surgery. CAUSES This condition may be caused by:  Heavy lifting.  Coughing over a long period of time.  Straining to have a bowel movement.  An incision made during an abdominal surgery.  A birth defect (congenital defect).  Excess weight or obesity.  Smoking.  Poor nutrition.  Cystic fibrosis.  Excess fluid in the abdomen.  Undescended testicles. SYMPTOMS Symptoms of a hernia include:  A lump on the abdomen. This is the first sign of a hernia. The lump may become more obvious with standing, straining, or coughing. It may get bigger over time if it is not treated or if the condition causing it is not treated.  Pain. A hernia is usually painless, but it may become painful over time if treatment is delayed. The pain is usually dull and may get worse with standing or lifting heavy  objects. Sometimes a hernia gets tightly squeezed in the weak spot (strangulated) or stuck there (incarcerated) and causes additional symptoms. These symptoms may include:  Vomiting.  Nausea.  Constipation.  Irritability. DIAGNOSIS A hernia may be diagnosed with:  A physical exam. During the exam your health care provider may ask you to cough or to make a specific movement, because a hernia is usually more visible when you move.  Imaging tests. These can include:  X-rays.  Ultrasound.  CT scan. TREATMENT A hernia that is small and painless may not need to be treated. A hernia that is large or painful may be treated with surgery. Inguinal hernias may be treated with surgery to prevent incarceration or strangulation. Strangulated hernias are always treated with surgery, because lack of blood to the trapped organ or tissue can cause it to die. Surgery to treat a hernia involves pushing the bulge back into place and repairing the weak part of the abdomen. HOME CARE INSTRUCTIONS  Avoid straining.  Do not lift anything heavier than 10 lb (4.5 kg).  Lift with your leg muscles, not your back muscles. This helps avoid strain.  When coughing, try to cough gently.  Prevent constipation. Constipation leads to straining with bowel movements, which can make a hernia worse or cause a hernia repair to break down. You can prevent constipation by:  Eating a high-fiber diet that includes plenty of fruits and vegetables.  Drinking enough fluids to keep your urine clear or pale yellow. Aim to drink 6-8 glasses of water per day.  Using a stool softener as directed by your health care provider.  Lose weight, if you are overweight.  Do not use any tobacco products, including cigarettes, chewing tobacco, or electronic cigarettes. If you need help quitting, ask your health care provider.  Keep all follow-up visits as directed by your health care provider. This is important. Your health care  provider may need to monitor your condition. SEEK MEDICAL CARE IF:  You have swelling, redness, and pain in the affected area.  Your bowel habits change. SEEK IMMEDIATE MEDICAL CARE IF:  You have a fever.  You have abdominal pain that is getting worse.  You feel nauseous or you vomit.  You cannot push the hernia back in place by gently pressing on it while you are lying down.  The hernia:  Changes in shape or size.  Is stuck outside the abdomen.  Becomes discolored.  Feels hard or tender.   This information is not intended to replace advice given to you by your health care provider. Make sure you discuss any questions you have with your health care provider.   Document Released: 02/24/2005 Document Revised: 03/17/2014 Document Reviewed: 01/04/2014 Elsevier Interactive Patient Education [employer]

## 2015-06-06 ENCOUNTER — Encounter (HOSPITAL_COMMUNITY)
Admission: RE | Admit: 2015-06-06 | Discharge: 2015-06-06 | Disposition: A | Discharge status: Home / Self Care | Payer: BLUE CROSS/BLUE SHIELD | Source: Ambulatory Visit | Attending: General Surgery | Admitting: General Surgery

## 2015-06-06 ENCOUNTER — Encounter (HOSPITAL_COMMUNITY): Payer: Self-pay

## 2015-06-06 DIAGNOSIS — K219 Gastro-esophageal reflux disease without esophagitis: Secondary | ICD-10-CM | POA: Diagnosis not present

## 2015-06-06 DIAGNOSIS — E039 Hypothyroidism, unspecified: Secondary | ICD-10-CM | POA: Diagnosis not present

## 2015-06-06 DIAGNOSIS — Z7902 Long term (current) use of antithrombotics/antiplatelets: Secondary | ICD-10-CM | POA: Diagnosis not present

## 2015-06-06 DIAGNOSIS — K432 Incisional hernia without obstruction or gangrene: Secondary | ICD-10-CM | POA: Diagnosis present

## 2015-06-06 DIAGNOSIS — Z7982 Long term (current) use of aspirin: Secondary | ICD-10-CM | POA: Diagnosis not present

## 2015-06-06 DIAGNOSIS — E119 Type 2 diabetes mellitus without complications: Secondary | ICD-10-CM | POA: Diagnosis not present

## 2015-06-06 DIAGNOSIS — Z794 Long term (current) use of insulin: Secondary | ICD-10-CM | POA: Diagnosis not present

## 2015-06-06 DIAGNOSIS — I2510 Atherosclerotic heart disease of native coronary artery without angina pectoris: Secondary | ICD-10-CM | POA: Diagnosis not present

## 2015-06-06 DIAGNOSIS — F17200 Nicotine dependence, unspecified, uncomplicated: Secondary | ICD-10-CM | POA: Diagnosis not present

## 2015-06-06 DIAGNOSIS — E7800 Pure hypercholesterolemia, unspecified: Secondary | ICD-10-CM | POA: Diagnosis not present

## 2015-06-06 DIAGNOSIS — Z79899 Other long term (current) drug therapy: Secondary | ICD-10-CM | POA: Diagnosis not present

## 2015-06-06 DIAGNOSIS — Z01812 Encounter for preprocedural laboratory examination: Secondary | ICD-10-CM | POA: Diagnosis not present

## 2015-06-06 LAB — BASIC METABOLIC PANEL
Anion gap: 11 (ref 5–15)
BUN: 13 mg/dL (ref 6–20)
CALCIUM: 8.7 mg/dL — AB (ref 8.9–10.3)
CO2: 23 mmol/L (ref 22–32)
CREATININE: 0.61 mg/dL (ref 0.44–1.00)
Chloride: 102 mmol/L (ref 101–111)
GFR calc Af Amer: >60 mL/min (ref >60)
GFR calc non Af Amer: >60 mL/min (ref >60)
GLUCOSE: 219 mg/dL — AB (ref 65–99)
POTASSIUM: 4.3 mmol/L (ref 3.5–5.1)
Sodium: 136 mmol/L (ref 135–145)

## 2015-06-06 LAB — HCG, SERUM, QUALITATIVE: PREG SERUM: NEGATIVE

## 2015-06-06 LAB — CBC WITH DIFFERENTIAL/PLATELET
Basophils Absolute: 0 10*3/uL (ref 0.0–0.1)
Basophils Relative: 0 %
Eosinophils Absolute: 0.2 10*3/uL (ref 0.0–0.7)
Eosinophils Relative: 2 %
HEMATOCRIT: 42.6 % (ref 36.0–46.0)
Hemoglobin: 14.2 g/dL (ref 12.0–15.0)
LYMPHS PCT: 25 %
Lymphs Abs: 2.4 10*3/uL (ref 0.7–4.0)
MCH: 30.3 pg (ref 26.0–34.0)
MCHC: 33.3 g/dL (ref 30.0–36.0)
MCV: 91 fL (ref 78.0–100.0)
MONO ABS: 0.4 10*3/uL (ref 0.1–1.0)
MONOS PCT: 4 %
NEUTROS ABS: 6.8 10*3/uL (ref 1.7–7.7)
Neutrophils Relative %: 69 %
Platelets: 262 10*3/uL (ref 150–400)
RBC: 4.68 MIL/uL (ref 3.87–5.11)
RDW: 16 % — AB (ref 11.5–15.5)
WBC: 9.8 10*3/uL (ref 4.0–10.5)

## 2015-06-06 NOTE — Progress Notes (Signed)
Dr Jayme Cloud aware of ER visit 05/07/2015. No orders given.

## 2015-06-08 ENCOUNTER — Ambulatory Visit (HOSPITAL_COMMUNITY): Payer: BLUE CROSS/BLUE SHIELD | Admitting: Anesthesiology

## 2015-06-08 ENCOUNTER — Encounter (HOSPITAL_COMMUNITY)
Admission: RE | Disposition: A | Discharge status: Home / Self Care | Payer: Self-pay | Source: Ambulatory Visit | Attending: General Surgery

## 2015-06-08 ENCOUNTER — Ambulatory Visit (HOSPITAL_COMMUNITY)
Admission: RE | Admit: 2015-06-08 | Discharge: 2015-06-08 | Disposition: A | Discharge status: Home / Self Care | Payer: BLUE CROSS/BLUE SHIELD | Source: Ambulatory Visit | Attending: General Surgery | Admitting: General Surgery

## 2015-06-08 ENCOUNTER — Encounter (HOSPITAL_COMMUNITY): Payer: Self-pay | Admitting: *Deleted

## 2015-06-08 DIAGNOSIS — F17200 Nicotine dependence, unspecified, uncomplicated: Secondary | ICD-10-CM | POA: Insufficient documentation

## 2015-06-08 DIAGNOSIS — K432 Incisional hernia without obstruction or gangrene: Secondary | ICD-10-CM | POA: Insufficient documentation

## 2015-06-08 DIAGNOSIS — Z794 Long term (current) use of insulin: Secondary | ICD-10-CM | POA: Insufficient documentation

## 2015-06-08 DIAGNOSIS — K219 Gastro-esophageal reflux disease without esophagitis: Secondary | ICD-10-CM | POA: Insufficient documentation

## 2015-06-08 DIAGNOSIS — E119 Type 2 diabetes mellitus without complications: Secondary | ICD-10-CM | POA: Insufficient documentation

## 2015-06-08 DIAGNOSIS — I2510 Atherosclerotic heart disease of native coronary artery without angina pectoris: Secondary | ICD-10-CM | POA: Insufficient documentation

## 2015-06-08 DIAGNOSIS — E7800 Pure hypercholesterolemia, unspecified: Secondary | ICD-10-CM | POA: Insufficient documentation

## 2015-06-08 DIAGNOSIS — E039 Hypothyroidism, unspecified: Secondary | ICD-10-CM | POA: Insufficient documentation

## 2015-06-08 DIAGNOSIS — Z01812 Encounter for preprocedural laboratory examination: Secondary | ICD-10-CM | POA: Insufficient documentation

## 2015-06-08 DIAGNOSIS — Z7982 Long term (current) use of aspirin: Secondary | ICD-10-CM | POA: Insufficient documentation

## 2015-06-08 DIAGNOSIS — Z79899 Other long term (current) drug therapy: Secondary | ICD-10-CM | POA: Insufficient documentation

## 2015-06-08 DIAGNOSIS — Z7902 Long term (current) use of antithrombotics/antiplatelets: Secondary | ICD-10-CM | POA: Insufficient documentation

## 2015-06-08 HISTORY — PX: INCISIONAL HERNIA REPAIR: SHX193

## 2015-06-08 HISTORY — PX: INSERTION OF MESH: SHX5868

## 2015-06-08 LAB — GLUCOSE, CAPILLARY
GLUCOSE-CAPILLARY: 252 mg/dL — AB (ref 65–99)
Glucose-Capillary: 248 mg/dL — ABNORMAL HIGH (ref 65–99)

## 2015-06-08 SURGERY — REPAIR, HERNIA, INCISIONAL
Anesthesia: General | Site: Abdomen

## 2015-06-08 MED ORDER — GLYCOPYRROLATE 0.2 MG/ML IJ SOLN
INTRAMUSCULAR | Status: DC | PRN
  Administered 2015-06-08: 0.6 mg via INTRAVENOUS

## 2015-06-08 MED ORDER — LACTATED RINGERS IV SOLN
INTRAVENOUS | Status: DC
  Administered 2015-06-08: 1000 mL via INTRAVENOUS

## 2015-06-08 MED ORDER — BUPIVACAINE HCL (PF) 0.5 % IJ SOLN
INTRAMUSCULAR | Status: DC | PRN
  Administered 2015-06-08: 10 mL

## 2015-06-08 MED ORDER — MIDAZOLAM HCL 2 MG/2ML IJ SOLN
INTRAMUSCULAR | Status: AC
  Filled 2015-06-08: qty 2

## 2015-06-08 MED ORDER — NEOSTIGMINE METHYLSULFATE 10 MG/10ML IV SOLN
INTRAVENOUS | Status: DC | PRN
  Administered 2015-06-08: 2 mg via INTRAVENOUS

## 2015-06-08 MED ORDER — FENTANYL CITRATE (PF) 100 MCG/2ML IJ SOLN
INTRAMUSCULAR | Status: DC | PRN
  Administered 2015-06-08 (×2): 50 ug via INTRAVENOUS

## 2015-06-08 MED ORDER — POVIDONE-IODINE 10 % EX OINT
TOPICAL_OINTMENT | CUTANEOUS | Status: AC
  Filled 2015-06-08: qty 1

## 2015-06-08 MED ORDER — PROPOFOL 10 MG/ML IV BOLUS
INTRAVENOUS | Status: DC | PRN
  Administered 2015-06-08: 150 mg via INTRAVENOUS

## 2015-06-08 MED ORDER — BUPIVACAINE HCL (PF) 0.5 % IJ SOLN
INTRAMUSCULAR | Status: AC
  Filled 2015-06-08: qty 30

## 2015-06-08 MED ORDER — OXYCODONE HCL 10 MG PO TABS: 10.0000 mg | ORAL_TABLET | ORAL | Status: DC | PRN

## 2015-06-08 MED ORDER — 0.9 % SODIUM CHLORIDE (POUR BTL) OPTIME
TOPICAL | Status: DC | PRN
  Administered 2015-06-08: 1000 mL

## 2015-06-08 MED ORDER — CEFAZOLIN SODIUM-DEXTROSE 2-4 GM/100ML-% IV SOLN
2.0000 g | INTRAVENOUS | Status: AC
  Administered 2015-06-08: 2 g via INTRAVENOUS
  Filled 2015-06-08: qty 100

## 2015-06-08 MED ORDER — POVIDONE-IODINE 10 % OINT PACKET
TOPICAL_OINTMENT | CUTANEOUS | Status: DC | PRN
  Administered 2015-06-08: 1 via TOPICAL

## 2015-06-08 MED ORDER — CHLORHEXIDINE GLUCONATE 4 % EX LIQD: 1.0000 "application " | Freq: Once | CUTANEOUS | Status: DC

## 2015-06-08 MED ORDER — LIDOCAINE HCL 1 % IJ SOLN
INTRAMUSCULAR | Status: DC | PRN
Start: 2015-06-08 — End: 2015-06-08
  Administered 2015-06-08: 25 mg via INTRADERMAL

## 2015-06-08 MED ORDER — FENTANYL CITRATE (PF) 100 MCG/2ML IJ SOLN
25.0000 ug | INTRAMUSCULAR | Status: DC | PRN
  Administered 2015-06-08: 50 ug via INTRAVENOUS
  Filled 2015-06-08: qty 2

## 2015-06-08 MED ORDER — DEXAMETHASONE SODIUM PHOSPHATE 4 MG/ML IJ SOLN
INTRAMUSCULAR | Status: AC
  Filled 2015-06-08: qty 1

## 2015-06-08 MED ORDER — MIDAZOLAM HCL 5 MG/5ML IJ SOLN
INTRAMUSCULAR | Status: DC | PRN
  Administered 2015-06-08: 2 mg via INTRAVENOUS

## 2015-06-08 MED ORDER — ONDANSETRON HCL 4 MG/2ML IJ SOLN
INTRAMUSCULAR | Status: AC
  Filled 2015-06-08: qty 2

## 2015-06-08 MED ORDER — PROPOFOL 10 MG/ML IV BOLUS
INTRAVENOUS | Status: AC
  Filled 2015-06-08: qty 20

## 2015-06-08 MED ORDER — FENTANYL CITRATE (PF) 250 MCG/5ML IJ SOLN
INTRAMUSCULAR | Status: AC
  Filled 2015-06-08: qty 5

## 2015-06-08 MED ORDER — ONDANSETRON HCL 4 MG/2ML IJ SOLN: 4.0000 mg | Freq: Once | INTRAMUSCULAR | Status: DC | PRN

## 2015-06-08 MED ORDER — ONDANSETRON HCL 4 MG/2ML IJ SOLN
4.0000 mg | Freq: Once | INTRAMUSCULAR | Status: AC
  Administered 2015-06-08: 4 mg via INTRAVENOUS

## 2015-06-08 MED ORDER — MIDAZOLAM HCL 2 MG/2ML IJ SOLN
1.0000 mg | INTRAMUSCULAR | Status: DC | PRN
  Administered 2015-06-08: 2 mg via INTRAVENOUS

## 2015-06-08 MED ORDER — ROCURONIUM BROMIDE 100 MG/10ML IV SOLN
INTRAVENOUS | Status: DC | PRN
  Administered 2015-06-08: 5 mg via INTRAVENOUS
  Administered 2015-06-08: 30 mg via INTRAVENOUS
  Administered 2015-06-08: 5 mg via INTRAVENOUS

## 2015-06-08 MED ORDER — KETOROLAC TROMETHAMINE 30 MG/ML IJ SOLN
30.0000 mg | Freq: Once | INTRAMUSCULAR | Status: AC
  Administered 2015-06-08: 30 mg via INTRAVENOUS
  Filled 2015-06-08: qty 1

## 2015-06-08 MED ORDER — DEXAMETHASONE SODIUM PHOSPHATE 4 MG/ML IJ SOLN
4.0000 mg | Freq: Once | INTRAMUSCULAR | Status: AC
  Administered 2015-06-08: 4 mg via INTRAVENOUS

## 2015-06-08 MED ORDER — BUPIVACAINE LIPOSOME 1.3 % IJ SUSP
INTRAMUSCULAR | Status: AC
  Filled 2015-06-08: qty 20

## 2015-06-08 SURGICAL SUPPLY — 36 items
BAG HAMPER (MISCELLANEOUS) ×3 IMPLANT
CHLORAPREP W/TINT 26ML (MISCELLANEOUS) ×3 IMPLANT
CLOTH BEACON ORANGE TIMEOUT ST (SAFETY) ×3 IMPLANT
COVER LIGHT HANDLE STERIS (MISCELLANEOUS) ×6 IMPLANT
DECANTER SPIKE VIAL GLASS SM (MISCELLANEOUS) ×2 IMPLANT
ELECT REM PT RETURN 9FT ADLT (ELECTROSURGICAL) ×3
ELECTRODE REM PT RTRN 9FT ADLT (ELECTROSURGICAL) ×1 IMPLANT
GAUZE SPONGE 4X4 12PLY STRL (GAUZE/BANDAGES/DRESSINGS) ×3 IMPLANT
GLOVE BIOGEL M 7.0 STRL (GLOVE) ×4 IMPLANT
GLOVE BIOGEL PI IND STRL 7.0 (GLOVE) ×1 IMPLANT
GLOVE BIOGEL PI INDICATOR 7.0 (GLOVE) ×10
GLOVE ECLIPSE 6.5 STRL STRAW (GLOVE) ×2 IMPLANT
GLOVE SURG SS PI 7.5 STRL IVOR (GLOVE) ×3 IMPLANT
GOWN STRL REUS W/ TWL LRG LVL3 (GOWN DISPOSABLE) ×1 IMPLANT
GOWN STRL REUS W/ TWL XL LVL3 (GOWN DISPOSABLE) ×1 IMPLANT
GOWN STRL REUS W/TWL LRG LVL3 (GOWN DISPOSABLE) ×3
GOWN STRL REUS W/TWL XL LVL3 (GOWN DISPOSABLE) ×3
INST SET MINOR GENERAL (KITS) ×2 IMPLANT
KIT ROOM TURNOVER APOR (KITS) ×3 IMPLANT
MANIFOLD NEPTUNE II (INSTRUMENTS) ×3 IMPLANT
MESH VENTRALEX ST 1-7/10 CRC S (Mesh General) ×2 IMPLANT
NDL HYPO 25X1 1.5 SAFETY (NEEDLE) IMPLANT
NEEDLE HYPO 25X1 1.5 SAFETY (NEEDLE) ×3 IMPLANT
NS IRRIG 1000ML POUR BTL (IV SOLUTION) ×3 IMPLANT
PACK MINOR (CUSTOM PROCEDURE TRAY) ×2 IMPLANT
PAD ARMBOARD 7.5X6 YLW CONV (MISCELLANEOUS) ×3 IMPLANT
SET BASIN LINEN APH (SET/KITS/TRAYS/PACK) ×3 IMPLANT
STAPLER VISISTAT (STAPLE) ×2 IMPLANT
SUT ETHIBOND 0 MO6 C/R (SUTURE) ×2 IMPLANT
SUT VIC AB 2-0 CT1 27 (SUTURE) ×3
SUT VIC AB 2-0 CT1 TAPERPNT 27 (SUTURE) ×1 IMPLANT
SUT VIC AB 3-0 SH 27 (SUTURE)
SUT VIC AB 3-0 SH 27X BRD (SUTURE) ×1 IMPLANT
SUT VICRYL AB 2 0 TIES (SUTURE) ×2 IMPLANT
SYR 20CC LL (SYRINGE) ×3 IMPLANT
TAPE CLOTH SURG 4X10 WHT LF (GAUZE/BANDAGES/DRESSINGS) ×2 IMPLANT

## 2015-06-08 NOTE — Anesthesia Procedure Notes (Signed)
Procedure Name: Intubation Date/Time: 06/08/2015 7:46 AM Performed by: Despina Hidden Pre-anesthesia Checklist: Patient identified, Emergency Drugs available, Suction available and Patient being monitored Patient Re-evaluated:Patient Re-evaluated prior to inductionOxygen Delivery Method: Circle system utilized Preoxygenation: Pre-oxygenation with 100% oxygen Intubation Type: IV induction and Cricoid Pressure applied Ventilation: Mask ventilation without difficulty and Oral airway inserted - appropriate to patient size Laryngoscope Size: Mac and 3 Grade View: Grade II Tube type: Oral Tube size: 7.0 mm Number of attempts: 1 Airway Equipment and Method: Stylet and Oral airway Placement Confirmation: ETT inserted through vocal cords under direct vision,  positive ETCO2 and breath sounds checked- equal and bilateral Secured at: 21 cm Tube secured with: Tape Dental Injury: Teeth and Oropharynx as per pre-operative assessment  Difficulty Due To: Difficult Airway- due to anterior larynx and Difficulty was unanticipated Future Recommendations: Recommend- induction with short-acting agent, and alternative techniques readily available

## 2015-06-08 NOTE — Transfer of Care (Signed)
Immediate Anesthesia Transfer of Care Note  Patient: [name]  Procedure(s) Performed: Procedure(s): INCISIONAL HERNIORRHAPHY WITH MESH (N/A) INSERTION OF MESH  Patient Location: PACU  Anesthesia Type:General  Level of Consciousness: awake and patient cooperative  Airway & Oxygen Therapy: Patient Spontanous Breathing and Patient connected to face mask oxygen  Post-op Assessment: Report given to RN, Post -op Vital signs reviewed and stable and Patient moving all extremities  Post vital signs: Reviewed and stable  Last Vitals:  Filed Vitals:   06/08/15 0715 06/08/15 0730  BP:  111/58  Pulse:    Temp:    Resp: 20 15    Complications: No apparent anesthesia complications

## 2015-06-08 NOTE — Discharge Instructions (Signed)
Open Hernia Repair, Care After °Refer to this sheet in the next few weeks. These instructions provide you with information on caring for yourself after your procedure. Your health care provider may also give you more specific instructions. Your treatment has been planned according to current medical practices, but problems sometimes occur. Call your health care provider if you have any problems or questions after your procedure. °WHAT TO EXPECT AFTER THE PROCEDURE °After your procedure, it is typical to have the following: °· Pain in your abdomen, especially along your incision. You will be given pain medicines to control the pain. °· Constipation. You may be given a stool softener to help prevent this. °HOME CARE INSTRUCTIONS °· Only take over-the-counter or prescription medicines as directed by your health care provider. °· Keep the incision area dry and clean. You may wash the incision area gently with soap and water 48 hours after surgery. Gently blot or dab the incision area dry. Do not take baths, use swimming pools, or use hot tubs for 10 days or until your health care provider approves. °· Change bandages (dressings) as directed by your health care provider. °· Continue your normal diet as directed by your health care provider. Eat plenty of fruits and vegetables to help prevent constipation. °· Drink enough fluids to keep your urine clear or pale yellow. This also helps prevent constipation. °· Do not drive until your health care provider says it is okay. °· Do not lift anything heavier than 10 lb (4.5 kg) or play contact sports for 4 weeks or until your health care provider approves. °· Follow up with your health care provider as directed. Ask your health care provider when to make an appointment to have your stitches (sutures) or staples removed. °SEEK MEDICAL CARE IF: °· You have increased bleeding coming from the incision site. °· You have blood in your stool. °· You have increasing pain in the incision  area. °· You see redness or swelling in the incision area. °· You have fluid (pus) coming from the incision. °· You have a fever. °· You notice a bad smell coming from the incision area or dressing. °SEEK IMMEDIATE MEDICAL CARE IF: °· You develop a rash. °· You have chest pain or shortness of breath. °· You feel lightheaded or feel faint. °  °This information is not intended to replace advice given to you by your health care provider. Make sure you discuss any questions you have with your health care provider. °  °Document Released: 09/13/2004 Document Revised: 03/17/2014 Document Reviewed: 10/06/2012 °Elsevier Interactive Patient Education ©2016 [employer] °Ventral Hernia °A ventral hernia (also called an incisional hernia) is a hernia that occurs at the site of a previous surgical cut (incision) in the abdomen. The abdominal wall spans from your lower chest down to your pelvis. If the abdominal wall is weakened from a surgical incision, a hernia can occur. A hernia is a bulge of bowel or muscle tissue pushing out on the weakened part of the abdominal wall. Ventral hernias can get bigger from straining or lifting. °Obese and older people are at higher risk for a ventral hernia. People who develop infections after surgery or require repeat incisions at the same site on the abdomen are also at increased risk. °CAUSES  °A ventral hernia occurs because of weakness in the abdominal wall at an incision site.  °SYMPTOMS  °Common symptoms include: °· A visible bulge or lump on the abdominal wall. °· Pain or tenderness around the lump. °·   Increased discomfort if you cough or make a sudden movement. °If the hernia has blocked part of the intestine, a serious complication can occur (incarcerated or strangulated hernia). This can become a problem that requires emergency surgery because the blood flow to the blocked intestine may be cut off. Symptoms may include: °· Feeling sick to your stomach (nauseous). °· Throwing up  (vomiting). °· Stomach swelling (distention) or bloating. °· Fever. °· Rapid heartbeat. °DIAGNOSIS  °Your health care provider will take a medical history and perform a physical exam. Various tests may be ordered, such as: °· Blood tests. °· Urine tests. °· Ultrasonography. °· X-rays. °· Computed tomography (CT). °TREATMENT  °Watchful waiting may be all that is needed for a smaller hernia that does not cause symptoms. Your health care provider may recommend the use of a supportive belt (truss) that helps to keep the abdominal wall intact. For larger hernias or those that cause pain, surgery to repair the hernia is usually recommended. If a hernia becomes strangulated, emergency surgery needs to be done right away. °HOME CARE INSTRUCTIONS °· Avoid putting pressure or strain on the abdominal area. °· Avoid heavy lifting. °· Use good body positioning for physical tasks. Ask your health care provider about proper body positioning. °· Use a supportive belt as directed by your health care provider. °· Maintain a healthy weight. °· Eat foods that are high in fiber, such as whole grains, fruits, and vegetables. Fiber helps prevent difficult bowel movements (constipation). °· Drink enough fluids to keep your urine clear or pale yellow. °· Follow up with your health care provider as directed. °SEEK MEDICAL CARE IF:  °· Your hernia seems to be getting larger or more painful. °SEEK IMMEDIATE MEDICAL CARE IF:  °· You have abdominal pain that is sudden and sharp. °· Your pain becomes severe. °· You have repeated vomiting. °· You are sweating a lot. °· You notice a rapid heartbeat. °· You develop a fever. °MAKE SURE YOU:  °· Understand these instructions. °· Will watch your condition. °· Will get help right away if you are not doing well or get worse. °  °This information is not intended to replace advice given to you by your health care provider. Make sure you discuss any questions you have with your health care provider. °    °Document Released: 02/11/2012 Document Revised: 03/17/2014 Document Reviewed: 02/11/2012 °Elsevier Interactive Patient Education ©2016 [employer] ° °

## 2015-06-08 NOTE — Op Note (Signed)
Patient:  [name]  DOB:  02/02/1966  MRN:  [id number]   Preop Diagnosis:  Incisional hernia  Postop Diagnosis:  Same  Procedure:  Incisional herniorrhaphy with mesh  Surgeon:  Franky Macho, M.D.  Anes:  Gen. endotracheal  Indications:  Patient is a 50 year old white female status post an open cholecystectomy in the remote past who presents with an incisional hernia along the medial aspect. The risks and benefits of the procedure including bleeding, infection, mesh use, and the possibility of recurrence of the hernia were fully explained to the patient, who gave informed consent.  Procedure note:  The patient was placed the supine position. After induction of general endotracheal anesthesia, the abdomen was prepped and draped using the usual sterile technique with DuraPrep. Surgical site confirmation was performed.  An incision was made along the medial aspect of the previous surgical scar. This was extended medially. Dissection taken down to the fascia. A 2 cm defect was present. Adipose tissue was present emanating from the peritoneum. A portion of this was excised and ligated using 2-0 Vicryl ties. This was reduced. A Bard Ventralax ST 4.3 cm patch was then inserted and secured to the fascia using 0 Ethibond interrupted sutures. The subcutaneous layer was reapproximated using 2-0 Vicryl interrupted sutures. 0.5% Sensorcaine was instilled into the surrounding wound. The skin was closed with staples. Betadine ointment and dry sterile dressings were applied.  All tape and needle counts were correct at the end of the procedure. The patient was extubated in the operating room and transferred to PACU in stable condition.  Complications:  None  EBL:  Minimal  Specimen:  None

## 2015-06-08 NOTE — Anesthesia Postprocedure Evaluation (Signed)
Anesthesia Post Note  Patient: [name]  Procedure(s) Performed: Procedure(s) (LRB): INCISIONAL HERNIORRHAPHY WITH MESH (N/A) INSERTION OF MESH  Patient location during evaluation: PACU Anesthesia Type: General Level of consciousness: awake and patient cooperative Pain management: pain level controlled Vital Signs Assessment: post-procedure vital signs reviewed and stable Respiratory status: spontaneous breathing, nonlabored ventilation and respiratory function stable Cardiovascular status: blood pressure returned to baseline and stable Postop Assessment: no signs of nausea or vomiting Anesthetic complications: no    Last Vitals:  Filed Vitals:   06/08/15 0835 06/08/15 0845  BP:  128/63  Pulse:  64  Temp: 37 C   Resp:  16    Last Pain:  Filed Vitals:   06/08/15 0852  PainSc: 2                  Aujanae Mccullum J

## 2015-06-08 NOTE — Anesthesia Preprocedure Evaluation (Addendum)
Anesthesia Evaluation  Patient identified by MRN, date of birth, ID band Patient awake    Reviewed: Allergy & Precautions, NPO status , Patient's Chart, lab work & pertinent test results  Airway Mallampati: II  TM Distance: >3 FB     Dental  (+) Teeth Intact, Dental Advisory Given   Pulmonary Current Smoker,    breath sounds clear to auscultation       Cardiovascular hypertension, Pt. on medications + CAD, + Past MI and + Cardiac Stents   Rhythm:Regular Rate:Normal     Neuro/Psych  Neuromuscular disease    GI/Hepatic hiatal hernia, GERD  ,  Endo/Other  diabetes, Poorly Controlled, Type 2Hypothyroidism   Renal/GU      Musculoskeletal   Abdominal   Peds  Hematology   Anesthesia Other Findings   Reproductive/Obstetrics                            Anesthesia Physical Anesthesia Plan  ASA: III  Anesthesia Plan: General   Post-op Pain Management:    Induction: Intravenous  Airway Management Planned: Oral ETT  Additional Equipment:   Intra-op Plan:   Post-operative Plan: Extubation in OR  Informed Consent: I have reviewed the patients History and Physical, chart, labs and discussed the procedure including the risks, benefits and alternatives for the proposed anesthesia with the patient or authorized representative who has indicated his/her understanding and acceptance.     Plan Discussed with:   Anesthesia Plan Comments:         Anesthesia Quick Evaluation                                  Anesthesia Evaluation  Patient identified by MRN, date of birth, ID band Patient awake    Reviewed: Allergy & Precautions, NPO status , Patient's Chart, lab work & pertinent test results  History of Anesthesia Complications Negative for: history of anesthetic complications  Airway Mallampati: II  TM Distance: >3 FB Neck ROM: Full    Dental no notable dental hx. (+) Dental  Advisory Given   Pulmonary Current Smoker,    Pulmonary exam normal breath sounds clear to auscultation       Cardiovascular hypertension, Pt. on medications + Past MI and + Cardiac Stents (stent in 2015)  Normal cardiovascular exam Rhythm:Regular Rate:Normal     Neuro/Psych negative neurological ROS  negative psych ROS   GI/Hepatic Neg liver ROS, GERD  Medicated and Controlled,  Endo/Other  diabetesHypothyroidism Morbid obesity  Renal/GU negative Renal ROS  negative genitourinary   Musculoskeletal negative musculoskeletal ROS (+)   Abdominal   Peds negative pediatric ROS (+)  Hematology negative hematology ROS (+)   Anesthesia Other Findings   Reproductive/Obstetrics negative OB ROS                             Anesthesia Physical Anesthesia Plan  ASA: III  Anesthesia Plan: MAC   Post-op Pain Management:    Induction: Intravenous  Airway Management Planned: Natural Airway  Additional Equipment:   Intra-op Plan:   Post-operative Plan:   Informed Consent: I have reviewed the patients History and Physical, chart, labs and discussed the procedure including the risks, benefits and alternatives for the proposed anesthesia with the patient or authorized representative who has indicated his/her understanding and acceptance.   Dental advisory given  Plan  Discussed with: CRNA  Anesthesia Plan Comments:         Anesthesia Quick Evaluation

## 2015-06-08 NOTE — Interval H&P Note (Signed)
History and Physical Interval Note:  06/08/2015 7:21 AM  [name]  has presented today for surgery, with the diagnosis of incisional hernia  The various methods of treatment have been discussed with the patient and family. After consideration of risks, benefits and other options for treatment, the patient has consented to  Procedure(s) with comments: HERNIA REPAIR INCISIONAL WITH MESH (N/A) - pt knows to arrive at 6:15 as a surgical intervention .  The patient's history has been reviewed, patient examined, no change in status, stable for surgery.  I have reviewed the patient's chart and labs.  Questions were answered to the patient's satisfaction.     Franky Macho A

## 2015-06-15 ENCOUNTER — Emergency Department (HOSPITAL_COMMUNITY): Payer: BLUE CROSS/BLUE SHIELD

## 2015-06-15 ENCOUNTER — Encounter (HOSPITAL_COMMUNITY): Payer: Self-pay | Admitting: Emergency Medicine

## 2015-06-15 ENCOUNTER — Emergency Department (HOSPITAL_COMMUNITY)
Admission: EM | Admit: 2015-06-15 | Discharge: 2015-06-15 | Disposition: A | Discharge status: Home / Self Care | Payer: BLUE CROSS/BLUE SHIELD | Attending: Emergency Medicine | Admitting: Emergency Medicine

## 2015-06-15 DIAGNOSIS — Z7984 Long term (current) use of oral hypoglycemic drugs: Secondary | ICD-10-CM | POA: Diagnosis not present

## 2015-06-15 DIAGNOSIS — Y658 Other specified misadventures during surgical and medical care: Secondary | ICD-10-CM | POA: Diagnosis not present

## 2015-06-15 DIAGNOSIS — K9189 Other postprocedural complications and disorders of digestive system: Secondary | ICD-10-CM | POA: Insufficient documentation

## 2015-06-15 DIAGNOSIS — E039 Hypothyroidism, unspecified: Secondary | ICD-10-CM | POA: Insufficient documentation

## 2015-06-15 DIAGNOSIS — Z79899 Other long term (current) drug therapy: Secondary | ICD-10-CM | POA: Diagnosis not present

## 2015-06-15 DIAGNOSIS — I252 Old myocardial infarction: Secondary | ICD-10-CM | POA: Diagnosis not present

## 2015-06-15 DIAGNOSIS — Z8719 Personal history of other diseases of the digestive system: Secondary | ICD-10-CM | POA: Diagnosis not present

## 2015-06-15 DIAGNOSIS — Z794 Long term (current) use of insulin: Secondary | ICD-10-CM | POA: Diagnosis not present

## 2015-06-15 DIAGNOSIS — F17210 Nicotine dependence, cigarettes, uncomplicated: Secondary | ICD-10-CM | POA: Diagnosis not present

## 2015-06-15 DIAGNOSIS — Z7982 Long term (current) use of aspirin: Secondary | ICD-10-CM | POA: Insufficient documentation

## 2015-06-15 DIAGNOSIS — L24A9 Irritant contact dermatitis due friction or contact with other specified body fluids: Secondary | ICD-10-CM

## 2015-06-15 DIAGNOSIS — Z79891 Long term (current) use of opiate analgesic: Secondary | ICD-10-CM | POA: Diagnosis not present

## 2015-06-15 DIAGNOSIS — E119 Type 2 diabetes mellitus without complications: Secondary | ICD-10-CM | POA: Diagnosis not present

## 2015-06-15 DIAGNOSIS — T148XXA Other injury of unspecified body region, initial encounter: Secondary | ICD-10-CM

## 2015-06-15 DIAGNOSIS — I10 Essential (primary) hypertension: Secondary | ICD-10-CM | POA: Insufficient documentation

## 2015-06-15 LAB — BASIC METABOLIC PANEL
Anion gap: 13 (ref 5–15)
BUN: 10 mg/dL (ref 6–20)
CHLORIDE: 102 mmol/L (ref 101–111)
CO2: 25 mmol/L (ref 22–32)
Calcium: 8.9 mg/dL (ref 8.9–10.3)
Creatinine, Ser: 0.68 mg/dL (ref 0.44–1.00)
GFR calc Af Amer: >60 mL/min (ref >60)
GFR calc non Af Amer: >60 mL/min (ref >60)
Glucose, Bld: 214 mg/dL — ABNORMAL HIGH (ref 65–99)
POTASSIUM: 4.1 mmol/L (ref 3.5–5.1)
SODIUM: 140 mmol/L (ref 135–145)

## 2015-06-15 LAB — URINE MICROSCOPIC-ADD ON
BACTERIA UA: NONE SEEN
RBC / HPF: NONE SEEN RBC/hpf (ref 0–5)
Squamous Epithelial / LPF: NONE SEEN
WBC, UA: NONE SEEN WBC/hpf (ref 0–5)

## 2015-06-15 LAB — URINALYSIS, ROUTINE W REFLEX MICROSCOPIC
Bilirubin Urine: NEGATIVE
HGB URINE DIPSTICK: NEGATIVE
Ketones, ur: NEGATIVE mg/dL
Leukocytes, UA: NEGATIVE
Nitrite: NEGATIVE
Protein, ur: NEGATIVE mg/dL
pH: 5 (ref 5.0–8.0)

## 2015-06-15 LAB — CBC WITH DIFFERENTIAL/PLATELET
Basophils Absolute: 0 10*3/uL (ref 0.0–0.1)
Basophils Relative: 0 %
EOS PCT: 2 %
Eosinophils Absolute: 0.2 10*3/uL (ref 0.0–0.7)
HCT: 43.4 % (ref 36.0–46.0)
HEMOGLOBIN: 14.6 g/dL (ref 12.0–15.0)
LYMPHS ABS: 3.1 10*3/uL (ref 0.7–4.0)
LYMPHS PCT: 23 %
MCH: 30.4 pg (ref 26.0–34.0)
MCHC: 33.6 g/dL (ref 30.0–36.0)
MCV: 90.2 fL (ref 78.0–100.0)
MONOS PCT: 4 %
Monocytes Absolute: 0.5 10*3/uL (ref 0.1–1.0)
Neutro Abs: 9.5 10*3/uL — ABNORMAL HIGH (ref 1.7–7.7)
Neutrophils Relative %: 71 %
PLATELETS: 296 10*3/uL (ref 150–400)
RBC: 4.81 MIL/uL (ref 3.87–5.11)
RDW: 15.6 % — ABNORMAL HIGH (ref 11.5–15.5)
WBC: 13.4 10*3/uL — AB (ref 4.0–10.5)

## 2015-06-15 LAB — LACTIC ACID, PLASMA
Lactic Acid, Venous: 2.3 mmol/L (ref 0.5–2.0)
Lactic Acid, Venous: 1.6 mmol/L (ref 0.5–2.0)

## 2015-06-15 LAB — CBG MONITORING, ED: Glucose-Capillary: 292 mg/dL — ABNORMAL HIGH (ref 65–99)

## 2015-06-15 MED ORDER — IOPAMIDOL (ISOVUE-300) INJECTION 61%
100.0000 mL | Freq: Once | INTRAVENOUS | Status: AC | PRN
  Administered 2015-06-15: 100 mL via INTRAVENOUS

## 2015-06-15 MED ORDER — SODIUM CHLORIDE 0.9 % IV BOLUS (SEPSIS)
500.0000 mL | Freq: Once | INTRAVENOUS | Status: AC
  Administered 2015-06-15: 500 mL via INTRAVENOUS

## 2015-06-15 MED ORDER — SULFAMETHOXAZOLE-TRIMETHOPRIM 800-160 MG PO TABS: 1.0000 | ORAL_TABLET | Freq: Two times a day (BID) | ORAL | Status: DC

## 2015-06-15 MED ORDER — SODIUM CHLORIDE 0.9 % IV SOLN
INTRAVENOUS | Status: DC
  Administered 2015-06-15: 14:00:00 via INTRAVENOUS

## 2015-06-15 MED ORDER — DIATRIZOATE MEGLUMINE & SODIUM 66-10 % PO SOLN
ORAL | Status: AC
  Filled 2015-06-15: qty 30

## 2015-06-15 MED ORDER — CEPHALEXIN 500 MG PO CAPS: 500.0000 mg | ORAL_CAPSULE | Freq: Four times a day (QID) | ORAL | Status: DC

## 2015-06-15 NOTE — ED Provider Notes (Signed)
CSN: [id number]     Arrival date & time 06/15/15  4098 History   First MD Initiated Contact with Patient 06/15/15 1016     Chief Complaint  Patient presents with  . Wound Check      HPI Pt was seen at 1040. Per pt, c/o gradual onset and persistence of constant "wound drainage" that began several days ago, worse since yesterday. Pt is s/p incisional hernia repair on 06/08/15, and had her sutures removed at her f/u appointment yesterday. Pt endorses the wound was draining at that time, and "opened up," so steri-strips were placed. Pt states the wound drainage continues today. Pt is concerned she "has an infection in there" and also questions if "the wound can be closed up."  Pt also c/o "sugars in the 300's" since her surgery. Pt has not f/u with her Endocrinologist regarding this. Denies abd pain, no N/V/D, no fevers, no back pain, no purulent drainage, no dysuria, no CP/SOB.     Past Medical History  Diagnosis Date  . Diabetes mellitus   . Hypertension   . High cholesterol   . GERD (gastroesophageal reflux disease)   . H/O hiatal hernia   . MI (myocardial infarction) (HCC) 2015  . Hypothyroidism   . History of kidney stones   . Neuromuscular disorder (HCC)     neuropathy; carpal tunnel BOTH WRISTS   Past Surgical History  Procedure Laterality Date  . Cholecystectomy    . Cesarean section      X 3  . Eus  11/18/2011    Dr. Dulce Sellar: gastric nodule, suspect benign leiomyoma or focal thicekning of muscularis propria with extension to the pyloric channel. Consider repeat EUS in 1-2 years.   . Colonoscopy with esophagogastroduodenoscopy (egd)      Dr. Randa Evens.   . Coronary angioplasty with stent placement  2015    STENT X 1  . Uterine ablation    . Esophagogastroduodenoscopy N/A 04/11/2015    Dr. Outlaw:Gastric nodule   . Eus N/A 04/11/2015    Dr. Dulce Sellar: 10X3mm nodule, no significant change in size since last EUS Sept 2013. no further surveillance needed   . Colonoscopy  April 2016     Dr. Teena Dunk: diverticulum in ascending colon, small internal hemorrhoids,   . Esophagogastroduodenoscopy  2013    Dr. Randa Evens: normal esophagus. medium-sized submucosal mass with no bleeding and no stigmata of recent bleeding, s/p biopsy. path: benign small bowel mucosa, surgical polyp with polypoid reactive-type gastropathy, negative h.pylori   . Colonoscopy  2013    Dr. Randa Evens: normal mucosa, internal hemorrhoids. path: benign colonic mucosa  . Incisional hernia repair N/A 06/08/2015    Procedure: Sherald Hess HERNIORRHAPHY WITH MESH;  Surgeon: Franky Macho, MD;  Location: [facility];  Service: General;  Laterality: N/A;  . Insertion of mesh N/A 06/08/2015    Procedure: INSERTION OF MESH;  Surgeon: Franky Macho, MD;  Location: [facility];  Service: General;  Laterality: N/A;   Family History  Problem Relation Age of Onset  . Colon cancer Neg Hx    Social History  Substance Use Topics  . Smoking status: Current Every Day Smoker -- 0.50 packs/day    Types: Cigarettes  . Smokeless tobacco: Never Used  . Alcohol Use: No    Review of Systems ROS: Statement: All systems negative except as marked or noted in the HPI; Constitutional: Negative for fever and chills. ; ; Eyes: Negative for eye pain, redness and discharge. ; ; ENMT: Negative for ear pain, hoarseness, nasal  congestion, sinus pressure and sore throat. ; ; Cardiovascular: Negative for chest pain, palpitations, diaphoresis, dyspnea and peripheral edema. ; ; Respiratory: Negative for cough, wheezing and stridor. ; ; Gastrointestinal: Negative for nausea, vomiting, diarrhea, abdominal pain, blood in stool, hematemesis, jaundice and rectal bleeding. . ; ; Genitourinary: Negative for dysuria, flank pain and hematuria. ; ; Musculoskeletal: Negative for back pain and neck pain. Negative for swelling and trauma.; ; Skin: +surgical wound drainage. Negative for pruritus, rash, abrasions, blisters, bruising and skin lesion.; ; Neuro: Negative for headache,  lightheadedness and neck stiffness. Negative for weakness, altered level of consciousness , altered mental status, extremity weakness, paresthesias, involuntary movement, seizure and syncope.      Allergies  Review of patient's allergies indicates no known allergies.  Home Medications   Prior to Admission medications   Medication Sig Start Date End Date Taking? Authorizing Provider  acetaminophen (TYLENOL) 500 MG tablet Take 500 mg by mouth every 6 (six) hours as needed (Pain).   Yes Historical Provider, MD  albuterol (PROVENTIL HFA;VENTOLIN HFA) 108 (90 Base) MCG/ACT inhaler Inhale 2 puffs into the lungs every 6 (six) hours as needed for wheezing or shortness of breath. 05/07/15  Yes Richarda Overlie, MD  aspirin EC 81 MG tablet Take 81 mg by mouth daily.   Yes Historical Provider, MD  atorvastatin (LIPITOR) 40 MG tablet Take 40 mg by mouth every evening.   Yes Historical Provider, MD  clopidogrel (PLAVIX) 75 MG tablet Take 75 mg by mouth daily. Reported on 02/28/2015   Yes Historical Provider, MD  furosemide (LASIX) 20 MG tablet Take 20 mg by mouth daily.   Yes Historical Provider, MD  gabapentin (NEURONTIN) 300 MG capsule Take 300 mg by mouth 2 (two) times daily.   Yes Historical Provider, MD  Insulin Aspart (NOVOLOG [place]) Inject 0-16 Units into the skin 3 (three) times daily as needed (Using according to sliding scale at home.). Sliding  scale   Yes Historical Provider, MD  insulin detemir (LEVEMIR) 100 UNIT/ML injection Inject 60 Units into the skin at bedtime.   Yes Historical Provider, MD  INVOKANA 100 MG TABS tablet Take 100 mg by mouth daily. 05/24/15  Yes Historical Provider, MD  levothyroxine (SYNTHROID, LEVOTHROID) 75 MCG tablet Take 75 mcg by mouth daily before breakfast. Reported on 04/23/2015   Yes Historical Provider, MD  losartan (COZAAR) 50 MG tablet Take 50 mg by mouth daily.   Yes Historical Provider, MD  metFORMIN (GLUCOPHAGE) 1000 MG tablet Take 1,000 mg by mouth daily with  breakfast.   Yes Historical Provider, MD  naloxegol oxalate (MOVANTIK) 25 MG TABS tablet Take 1 tablet (25 mg total) by mouth daily. 1 hour before breakfast 04/23/15  Yes Nira Retort, NP  omeprazole (PRILOSEC) 40 MG capsule Take 40 mg by mouth daily.   Yes Historical Provider, MD  Oxycodone HCl 10 MG TABS Take 1 tablet (10 mg total) by mouth every 4 (four) hours as needed (Pain). 06/08/15  Yes Franky Macho, MD  Simethicone (GAS RELIEF PO) Take 2 capsules by mouth daily as needed (gas relief).   Yes Historical Provider, MD   BP 114/68 mmHg  Pulse 58  Temp(Src) 98.3 F (36.8 C) (Oral)  Resp 18  Ht 4\' 11"  (1.499 m)  Wt 183 lb (83.008 kg)  BMI 36.94 kg/m2  SpO2 97%  LMP 06/06/2014 Physical Exam  1045: Physical examination:  Nursing notes reviewed; Vital signs and O2 SAT reviewed;  Constitutional: Well developed, Well nourished, Well hydrated, In  no acute distress; Head:  Normocephalic, atraumatic; Eyes: EOMI, PERRL, No scleral icterus; ENMT: Mouth and pharynx normal, Mucous membranes moist; Neck: Supple, Full range of motion, No lymphadenopathy; Cardiovascular: Regular rate and rhythm, No murmur, rub, or gallop; Respiratory: Breath sounds clear & equal bilaterally, No rales, rhonchi, wheezes.  Speaking full sentences with ease, Normal respiratory effort/excursion; Chest: Nontender, Movement normal; Abdomen: Soft, Nontender, Nondistended, Normal bowel sounds. +partially open surgical wound on right abd, serosanguinous drainage, no purulence, no odor, no surrounding erythema or ecchymosis.; Genitourinary: No CVA tenderness; Extremities: Pulses normal, No tenderness, No edema, No calf edema or asymmetry.; Neuro: AA&Ox3, Major CN grossly intact.  Speech clear. No gross focal motor or sensory deficits in extremities.; Skin: Color normal, Warm, Dry.   ED Course  Procedures (including critical care time) Labs Review  Imaging Review  I have personally reviewed and evaluated these images and lab results  as part of my medical decision-making.   EKG Interpretation None      MDM  MDM Reviewed: previous chart, nursing note and vitals Reviewed previous: labs Interpretation: labs and CT scan     Results for orders placed or performed during the hospital encounter of 06/15/15  Lactic acid, plasma  Result Value Ref Range   Lactic Acid, Venous 2.3 (HH) 0.5 - 2.0 mmol/L  Basic metabolic panel  Result Value Ref Range   Sodium 140 135 - 145 mmol/L   Potassium 4.1 3.5 - 5.1 mmol/L   Chloride 102 101 - 111 mmol/L   CO2 25 22 - 32 mmol/L   Glucose, Bld 214 (H) 65 - 99 mg/dL   BUN 10 6 - 20 mg/dL   Creatinine, Ser 1.61 0.44 - 1.00 mg/dL   Calcium 8.9 8.9 - 09.6 mg/dL   GFR calc non Af Amer >60 >60 mL/min   GFR calc Af Amer >60 >60 mL/min   Anion gap 13 5 - 15  CBC with Differential  Result Value Ref Range   WBC 13.4 (H) 4.0 - 10.5 K/uL   RBC 4.81 3.87 - 5.11 MIL/uL   Hemoglobin 14.6 12.0 - 15.0 g/dL   HCT 04.5 40.9 - 81.1 %   MCV 90.2 78.0 - 100.0 fL   MCH 30.4 26.0 - 34.0 pg   MCHC 33.6 30.0 - 36.0 g/dL   RDW 91.4 (H) 78.2 - 95.6 %   Platelets 296 150 - 400 K/uL   Neutrophils Relative % 71 %   Neutro Abs 9.5 (H) 1.7 - 7.7 K/uL   Lymphocytes Relative 23 %   Lymphs Abs 3.1 0.7 - 4.0 K/uL   Monocytes Relative 4 %   Monocytes Absolute 0.5 0.1 - 1.0 K/uL   Eosinophils Relative 2 %   Eosinophils Absolute 0.2 0.0 - 0.7 K/uL   Basophils Relative 0 %   Basophils Absolute 0.0 0.0 - 0.1 K/uL  Urinalysis, Routine w reflex microscopic  Result Value Ref Range   Color, Urine YELLOW YELLOW   APPearance CLEAR CLEAR   Specific Gravity, Urine <1.005 (L) 1.005 - 1.030   pH 5.0 5.0 - 8.0   Glucose, UA >1000 (A) NEGATIVE mg/dL   Hgb urine dipstick NEGATIVE NEGATIVE   Bilirubin Urine NEGATIVE NEGATIVE   Ketones, ur NEGATIVE NEGATIVE mg/dL   Protein, ur NEGATIVE NEGATIVE mg/dL   Nitrite NEGATIVE NEGATIVE   Leukocytes, UA NEGATIVE NEGATIVE  Urine microscopic-add on  Result Value Ref Range    Squamous Epithelial / LPF NONE SEEN NONE SEEN   WBC, UA NONE SEEN 0 -  5 WBC/hpf   RBC / HPF NONE SEEN 0 - 5 RBC/hpf   Bacteria, UA NONE SEEN NONE SEEN  POC CBG, ED  Result Value Ref Range   Glucose-Capillary 292 (H) 65 - 99 mg/dL   Ct Abdomen Pelvis W Contrast 06/15/2015  CLINICAL DATA:  Hernia repair June 08, 2015.  Open wound drainage. EXAM: CT ABDOMEN AND PELVIS WITH CONTRAST TECHNIQUE: Multidetector CT imaging of the abdomen and pelvis was performed using the standard protocol following bolus administration of intravenous contrast. CONTRAST:  ISOVUE-300 IOPAMIDOL (ISOVUE-300) INJECTION 61% COMPARISON:  May 08, 2015 FINDINGS: Normal lung bases. No free air or free fluid is seen within the abdomen or pelvis. The patient is status post hernia repair. Apparent hernia mesh is identified. There is increased attenuation in the fat of the anterior abdominal wall, just superficial to the hernia repair. There are multiple foci of air within this fat stranding but no drainable fluid collections are seen. A skin defect is identified on series 2, image 24 consistent with reported open wound. Mild increased attenuation is seen in the fat just deep to the hernia repair in image 29. Hepatic steatosis is identified. The patient is status post cholecystectomy. The liver and portal veins are otherwise normal. The spleen, pancreas, adrenal glands, and right kidney are normal. A tiny stone is seen in the lower pole left kidney with no evidence of obstruction. The left kidney is otherwise normal. The abdominal aorta demonstrates mild atherosclerotic change with no aneurysm or dissection. No adenopathy is identified. The stomach and small bowel are within normal limits. The colon and appendix are normal. No other abnormality seen within the abdomen. The pelvis demonstrates no adenopathy or mass. The uterus and ovaries are normal. The bladder is unremarkable. Visualized bones are unremarkable and unchanged.  Delayed images through the upper abdomen demonstrate no filling defects in the opacified portions of the renal collecting systems. IMPRESSION: 1. The patient is status post hernia repair. There is fat stranding mostly in the anterior abdominal wall, anterior to the hernia repair. However, there is also small amount of fat stranding just deep to the repair. Much of the stranding could be due to the recent surgery. Multiple foci of air within the stranding superficially could be secondary to surgery 1 week ago. However, a superimposed infectious process is not excluded. No drainable fluid collection or abscess identified. Electronically Signed   By: Gerome Sam III M.D   On: 06/15/2015 13:08    1335:  IVF given for mildly elevated lactic acid and CBG. Pt is not acidotic with AG 13. Pt has tol PO well while in the ED without N/V. VS remain stable. T/C to General Surgery Dr. Lovell Sheehan, case discussed, including:  HPI, pertinent PM/SHx, VS/PE, dx testing, ED course and treatment:  States CT findings are likely usual post-op changes, requests to start 10 days abx, f/u in office as previously scheduled. Dx and testing, as well as d/w General Surgeon, d/w pt and family.  Questions answered.  Verb understanding, agreeable to d/c home with outpt f/u.   Samuel Jester, DO 06/18/15 [id number]

## 2015-06-15 NOTE — ED Notes (Signed)
CRITICAL VALUE ALERT  Critical value received:  Lactic Acid 2.3  Date of notification:  06/15/15  Time of notification:  1155  Critical value read back:Yes.    Nurse who received alert:  Fransico Him, RN  MD notified (1st page):  Dr. Clarene Duke  Time of first page:  1156  MD notified (2nd page):  Time of second page:  Responding MD:  Dr. Clarene Duke  Time MD responded:  [phone]

## 2015-06-15 NOTE — Discharge Instructions (Signed)
Take the prescriptions as directed.  Wash the area and apply the steri-strips/dressing, as previously instructed by your Surgeon.  Call your regular Surgeon on Monday to confirm your previously scheduled follow up appointment in the next week.  Return to the Emergency Department immediately if worsening.

## 2015-06-15 NOTE — ED Notes (Signed)
PT states had hernia surgery and had sutures removed to incision yesterday morning. PT compliant that incision is draining and steri-strips will not stay in place. PT also concerned because blood sugars have been elevated (300) at home since surgery.

## 2015-06-15 NOTE — ED Notes (Addendum)
New steri strips placed and ABD placed over top with paper tape in place. No drainage noted. Dressing clean, dry and intact.

## 2015-08-24 ENCOUNTER — Encounter: Payer: Self-pay | Admitting: Gastroenterology

## 2015-08-24 ENCOUNTER — Ambulatory Visit (INDEPENDENT_AMBULATORY_CARE_PROVIDER_SITE_OTHER): Payer: BLUE CROSS/BLUE SHIELD | Admitting: Gastroenterology

## 2015-08-24 VITALS — BP 130/73 | HR 60 | Temp 97.6°F | Ht 59.0 in | Wt 180.6 lb

## 2015-08-24 DIAGNOSIS — R109 Unspecified abdominal pain: Secondary | ICD-10-CM

## 2015-08-24 NOTE — Progress Notes (Signed)
Referring Provider: Samuel Jester, DO Primary Care Physician:  Samuel Jester, DO  Primary GI: Dr. Jena Gauss   Chief Complaint  Patient presents with  . Follow-up    HPI:   [name] is a 50 y.o. female presenting today with a history of bloating, nausea, constipation. Colonoscopy up-to-date as of last year, recent EUS completed due to history of gastric nodule. This remains essentially unchanged. No need for further follow-up/surveillance of this. GES Feb 2017 with accelerated gastric emptying. CTA negative for mesenteric ischemia but noted an upper abdominal ventral hernia, evaluated by Dr. Lovell Sheehan and underwent incisional hernia repair with mesh.   She was seen in the ED about a week later reporting incision draining. Lactic acid elevated at 2.3. Care of patient discussed by Dr. Clarene Duke and Dr. Lovell Sheehan, who recommended 10 days of antibiotics and following up in office. Her WBC count was 13.4 on presentation. She was prescribed Keflex.   She states she then went to [facility] 5/[facility] for about a week and underwent incision and drainage from what it sounds like. I do not have records. Her temp was almost 104 per her report. Believes she last saw Dr. Lovell Sheehan June 2nd. She does not want to return to see Dr. Lovell Sheehan or surgeons at [facility]. Reports "pin sticking" in abdomen at incision site and to left of incision. Pain unchanged and intermittent. No N/V. No fever/chills.   Past Medical History  Diagnosis Date  . Diabetes mellitus   . Hypertension   . High cholesterol   . GERD (gastroesophageal reflux disease)   . H/O hiatal hernia   . MI (myocardial infarction) (HCC) 2015  . Hypothyroidism   . History of kidney stones   . Neuromuscular disorder (HCC)     neuropathy; carpal tunnel BOTH WRISTS    Past Surgical History  Procedure Laterality Date  . Cholecystectomy    . Cesarean section      X 3  . Eus  11/18/2011    Dr. Dulce Sellar: gastric nodule, suspect benign leiomyoma or focal  thicekning of muscularis propria with extension to the pyloric channel. Consider repeat EUS in 1-2 years.   . Colonoscopy with esophagogastroduodenoscopy (egd)      Dr. Randa Evens.   . Coronary angioplasty with stent placement  2015    STENT X 1  . Uterine ablation    . Esophagogastroduodenoscopy N/A 04/11/2015    Dr. Outlaw:Gastric nodule   . Eus N/A 04/11/2015    Dr. Dulce Sellar: 10X70mm nodule, no significant change in size since last EUS Sept 2013. no further surveillance needed   . Colonoscopy  April 2016    Dr. Teena Dunk: diverticulum in ascending colon, small internal hemorrhoids,   . Esophagogastroduodenoscopy  2013    Dr. Randa Evens: normal esophagus. medium-sized submucosal mass with no bleeding and no stigmata of recent bleeding, s/p biopsy. path: benign small bowel mucosa, surgical polyp with polypoid reactive-type gastropathy, negative h.pylori   . Colonoscopy  2013    Dr. Randa Evens: normal mucosa, internal hemorrhoids. path: benign colonic mucosa  . Incisional hernia repair N/A 06/08/2015    Procedure: Sherald Hess HERNIORRHAPHY WITH MESH;  Surgeon: Franky Macho, MD;  Location: [facility];  Service: General;  Laterality: N/A;  . Insertion of mesh N/A 06/08/2015    Procedure: INSERTION OF MESH;  Surgeon: Franky Macho, MD;  Location: [facility];  Service: General;  Laterality: N/A;    Current Outpatient Prescriptions  Medication Sig Dispense Refill  . acetaminophen (TYLENOL) 500 MG tablet Take 500 mg  by mouth every 6 (six) hours as needed (Pain).    Marland Kitchen albuterol (PROVENTIL HFA;VENTOLIN HFA) 108 (90 Base) MCG/ACT inhaler Inhale 2 puffs into the lungs every 6 (six) hours as needed for wheezing or shortness of breath. 1 Inhaler 2  . aspirin EC 81 MG tablet Take 81 mg by mouth daily.    Marland Kitchen atorvastatin (LIPITOR) 40 MG tablet Take 40 mg by mouth every evening.    . clopidogrel (PLAVIX) 75 MG tablet Take 75 mg by mouth daily. Reported on 02/28/2015    . furosemide (LASIX) 20 MG tablet Take 20 mg by mouth daily.      Marland Kitchen gabapentin (NEURONTIN) 300 MG capsule Take 300 mg by mouth 2 (two) times daily.    . Insulin Aspart (NOVOLOG ) Inject 0-16 Units into the skin 3 (three) times daily as needed (Using according to sliding scale at home.). Sliding  scale    . insulin detemir (LEVEMIR) 100 UNIT/ML injection Inject 60 Units into the skin at bedtime.    . INVOKANA 100 MG TABS tablet Take 100 mg by mouth daily.  11  . levothyroxine (SYNTHROID, LEVOTHROID) 75 MCG tablet Take 75 mcg by mouth daily before breakfast. Reported on 04/23/2015    . losartan (COZAAR) 50 MG tablet Take 50 mg by mouth daily.    . metFORMIN (GLUCOPHAGE) 1000 MG tablet Take 1,000 mg by mouth daily with breakfast.    . naloxegol oxalate (MOVANTIK) 25 MG TABS tablet Take 1 tablet (25 mg total) by mouth daily. 1 hour before breakfast 30 tablet 3  . omeprazole (PRILOSEC) 40 MG capsule Take 40 mg by mouth daily.    . Oxycodone HCl 10 MG TABS Take 1 tablet (10 mg total) by mouth every 4 (four) hours as needed (Pain). 40 tablet 0  . Simethicone (GAS RELIEF PO) Take 2 capsules by mouth daily as needed (gas relief).     No current facility-administered medications for this visit.    Allergies as of 08/24/2015  . (No Known Allergies)    Family History  Problem Relation Age of Onset  . Colon cancer Neg Hx     Social History   Social History  . Marital Status: Married    Spouse Name: N/A  . Number of Children: N/A  . Years of Education: N/A   Social History Main Topics  . Smoking status: Current Every Day Smoker -- 0.50 packs/day    Types: Cigarettes  . Smokeless tobacco: Never Used  . Alcohol Use: No  . Drug Use: No  . Sexual Activity: Not Asked   Other Topics Concern  . None   Social History Narrative    Review of Systems: As mentioned in HPI   Physical Exam: BP 130/73 mmHg  Pulse 60  Temp(Src) 97.6 F (36.4 C) (Oral)  Ht  (1.499 m)  Wt 180 lb 9.6 oz (81.92 kg)  BMI 36.46 kg/m2 General:   Alert and oriented. No  distress noted. Pleasant and cooperative.  Head:  Normocephalic and atraumatic. Eyes:  Conjuctiva clear without scleral icterus. Abdomen:  +BS, soft, mild to moderate TTP at incision site and non-distended. No rebound or guarding. No HSM or masses noted. No peritoneal signs. No drainage or erythema  Msk:  Symmetrical without gross deformities. Normal posture. Extremities:  Without edema. Neurologic:  Alert and  oriented x4;  grossly normal neurologically. Psych:  Alert and cooperative. Normal mood and affect.

## 2015-08-24 NOTE — Assessment & Plan Note (Signed)
50 year old female s/p incisional hernia repair with mesh in March 2017, with subsequent abdominal wound infection requiring admission to [facility]. I do not have these records at time of visit. She continues with abdominal discomfort intermittently, which sounds more now like neuropathic in nature. Her abdominal exam is without peritoneal signs or overt evidence of infection. Incision site healed. Will request records from [place]. She is unwilling to return back to Dr. Lovell Sheehan for further follow-up or [facility], so I have referred her for a second opinion to [facility] in [place]. I elected to not repeat another CT, as clinically she is without concerning signs. However, if she develops fever, worsening abdominal discomfort, or signs/symptoms of infection, she is to call us and we will arrange a stat CT.

## 2015-08-24 NOTE — Patient Instructions (Signed)
I am referring you to [facility] for a second opinion.  Please call us if you have worsening pain, fever, chills, or redness around incision. I am on vacation next week, but someone can help order a CT scan if you have these symptoms.  Please keep [place] updated!

## 2015-08-27 ENCOUNTER — Other Ambulatory Visit: Payer: Self-pay

## 2015-08-27 DIAGNOSIS — Z9889 Other specified postprocedural states: Principal | ICD-10-CM

## 2015-08-27 DIAGNOSIS — Z8619 Personal history of other infectious and parasitic diseases: Secondary | ICD-10-CM

## 2015-08-27 DIAGNOSIS — Z8719 Personal history of other diseases of the digestive system: Secondary | ICD-10-CM

## 2015-08-27 NOTE — Progress Notes (Signed)
CC'D TO PCP °

## 2015-08-28 ENCOUNTER — Telehealth: Payer: Self-pay

## 2015-08-28 NOTE — Telephone Encounter (Signed)
Pt was referred by Gerrit Halls to see [facility] for her history of abcess and infection.    Sent referral to [facility] and received call from Henry at [facility] stating that after review of pt chart by 2 surgeons that they will have to decline referral and they advise that pt should be seen at [facility].  Does pt need to be sent to [facility]?  Please advise in [place] absence.

## 2015-08-29 NOTE — Telephone Encounter (Signed)
Yes, please make referral to [facility].

## 2015-08-29 NOTE — Telephone Encounter (Signed)
Pt called and spoke with Ginger this morning. She is aware of [facility] recommendations. And is aware that we are waiting on input from our providers.

## 2015-08-30 ENCOUNTER — Other Ambulatory Visit: Payer: Self-pay

## 2015-08-30 DIAGNOSIS — R109 Unspecified abdominal pain: Secondary | ICD-10-CM

## 2015-08-30 DIAGNOSIS — Z8619 Personal history of other infectious and parasitic diseases: Secondary | ICD-10-CM

## 2015-08-30 NOTE — Telephone Encounter (Signed)
Referral sent to [facility] 

## 2015-08-31 NOTE — Telephone Encounter (Signed)
LMOM about appointment at [facility] from 09/03/15 @ 10:15 with Dr.Powell on the the 5th floor of the [facility].

## 2015-10-03 ENCOUNTER — Ambulatory Visit: Payer: BLUE CROSS/BLUE SHIELD | Admitting: Gastroenterology

## 2015-11-07 ENCOUNTER — Ambulatory Visit: Payer: BLUE CROSS/BLUE SHIELD | Admitting: Gastroenterology

## 2016-01-30 ENCOUNTER — Ambulatory Visit (INDEPENDENT_AMBULATORY_CARE_PROVIDER_SITE_OTHER): Payer: BLUE CROSS/BLUE SHIELD | Admitting: Gastroenterology

## 2016-01-30 ENCOUNTER — Encounter: Payer: Self-pay | Admitting: Gastroenterology

## 2016-01-30 DIAGNOSIS — B370 Candidal stomatitis: Secondary | ICD-10-CM | POA: Insufficient documentation

## 2016-01-30 MED ORDER — NYSTATIN 100000 UNIT/ML MT SUSP: 5.0000 mL | Freq: Four times a day (QID) | OROMUCOSAL | 0 refills | Status: DC

## 2016-01-30 NOTE — Patient Instructions (Addendum)
Start taking the Nystatin 4 times a day. Swish it in your mouth as long as you can and then swallow. Continue for 2-3 weeks.   You can stop the carafate.   Continue Prilosec once each morning, 30 minutes before breakfast.   Call me next week if no improvement.   We will see you back in 6 weeks.

## 2016-01-30 NOTE — Progress Notes (Signed)
cc'ed to pcp °

## 2016-01-30 NOTE — Progress Notes (Signed)
Referring Provider: Samuel Jester, DO Primary Care Physician:  Samuel Jester, DO  Primary GI: Dr. Jena Gauss   Chief Complaint  Patient presents with  . Dysphagia    went to ER 01/28/16, trouble swallowing meds/foods, feels like gets stuck in chest, feels knot between breast  . Gastroesophageal Reflux    HPI:   [name] is a 50 y.o. female presenting today with a history of bloating, nausea, constipation. Colonoscopy up-to-date as of last year, recent EUS completed due to history of gastric nodule. This remains essentially unchanged. No need for further follow-up/surveillance of this. GES Feb 2017 with accelerated gastric emptying. CTA negative for mesenteric ischemia but noted an upper abdominal ventral hernia, evaluated by Dr. Lovell Sheehan and underwent incisional hernia repair with mesh. Appears last EGD in Feb 2017. Now with dysphagia. Went to [place] due to new onset dysphagia. BPE Jan 28, 2016 showed patent esophagus, no stricture or mass. Normal exam. Provided Carafate.   Saturday got strangled off of spit. Sunday every time trying to eat or drink, feels like it settles in her epigastric region. Feels like it is burning as it goes down. Can't get comfortable. Hurting from epigastric and wraps around back. States she was swollen between her breasts over the weekend. Gags taking Carafate tablets. On Prilosec once daily. Given Robaxin. No antibiotics. The only way she has the discomfort eased up is if she doesn't wear a bra. Ate chicken noodle soup for the last 2 days.   Seen at [facility] for chronic abdominal pain at site of hernia. Felt maybe a deep suture causing her symptoms.    Past Medical History:  Diagnosis Date  . Diabetes mellitus   . GERD (gastroesophageal reflux disease)   . H/O hiatal hernia   . High cholesterol   . History of kidney stones   . Hypertension   . Hypothyroidism   . MI (myocardial infarction) 2015  . Neuromuscular disorder (HCC)    neuropathy; carpal  tunnel BOTH WRISTS    Past Surgical History:  Procedure Laterality Date  . CESAREAN SECTION     X 3  . CHOLECYSTECTOMY    . COLONOSCOPY  April 2016   Dr. Teena Dunk: diverticulum in ascending colon, small internal hemorrhoids,   . COLONOSCOPY  2013   Dr. Randa Evens: normal mucosa, internal hemorrhoids. path: benign colonic mucosa  . COLONOSCOPY WITH ESOPHAGOGASTRODUODENOSCOPY (EGD)     Dr. Randa Evens.   . CORONARY ANGIOPLASTY WITH STENT PLACEMENT  2015   STENT X 1  . ESOPHAGOGASTRODUODENOSCOPY N/A 04/11/2015   Dr. Outlaw:Gastric nodule   . ESOPHAGOGASTRODUODENOSCOPY  2013   Dr. Randa Evens: normal esophagus. medium-sized submucosal mass with no bleeding and no stigmata of recent bleeding, s/p biopsy. path: benign small bowel mucosa, surgical polyp with polypoid reactive-type gastropathy, negative h.pylori   . EUS  11/18/2011   Dr. Dulce Sellar: gastric nodule, suspect benign leiomyoma or focal thicekning of muscularis propria with extension to the pyloric channel. Consider repeat EUS in 1-2 years.   . EUS N/A 04/11/2015   Dr. Dulce Sellar: 10X85mm nodule, no significant change in size since last EUS Sept 2013. no further surveillance needed   . INCISIONAL HERNIA REPAIR N/A 06/08/2015   Procedure: Sherald Hess HERNIORRHAPHY WITH MESH;  Surgeon: Franky Macho, MD;  Location: [facility];  Service: General;  Laterality: N/A;  . INSERTION OF MESH N/A 06/08/2015   Procedure: INSERTION OF MESH;  Surgeon: Franky Macho, MD;  Location: [facility];  Service: General;  Laterality: N/A;  . UTERINE ABLATION  Current Outpatient Prescriptions  Medication Sig Dispense Refill  . albuterol (PROVENTIL HFA;VENTOLIN HFA) 108 (90 Base) MCG/ACT inhaler Inhale 2 puffs into the lungs every 6 (six) hours as needed for wheezing or shortness of breath. 1 Inhaler 2  . aspirin EC 81 MG tablet Take 81 mg by mouth daily.    Marland Kitchen. atorvastatin (LIPITOR) 40 MG tablet Take 40 mg by mouth every evening.    . clopidogrel (PLAVIX) 75 MG tablet Take 75 mg by  mouth daily. Reported on 02/28/2015    . furosemide (LASIX) 20 MG tablet Take 20 mg by mouth daily.    Marland Kitchen. gabapentin (NEURONTIN) 300 MG capsule Take 300 mg by mouth 2 (two) times daily.    . Insulin Aspart (NOVOLOG [place]) Inject 0-16 Units into the skin 3 (three) times daily as needed (Using according to sliding scale at home.). Sliding  scale    . insulin detemir (LEVEMIR) 100 UNIT/ML injection Inject 60 Units into the skin at bedtime.    . INVOKANA 100 MG TABS tablet Take 100 mg by mouth daily.  11  . levothyroxine (SYNTHROID, LEVOTHROID) 75 MCG tablet Take 75 mcg by mouth daily before breakfast. Reported on 04/23/2015    . losartan (COZAAR) 50 MG tablet Take 50 mg by mouth daily.    . metFORMIN (GLUCOPHAGE) 1000 MG tablet Take 1,000 mg by mouth daily with breakfast.    . naloxegol oxalate (MOVANTIK) 25 MG TABS tablet Take 1 tablet (25 mg total) by mouth daily. 1 hour before breakfast 30 tablet 3  . omeprazole (PRILOSEC) 40 MG capsule Take 40 mg by mouth daily.    . Oxycodone HCl 10 MG TABS Take 1 tablet (10 mg total) by mouth every 4 (four) hours as needed (Pain). 40 tablet 0  . Simethicone (GAS RELIEF PO) Take 2 capsules by mouth daily as needed (gas relief).    . sucralfate (CARAFATE) 1 g tablet Take 1 tablet by mouth 4 (four) times daily.  0  . acetaminophen (TYLENOL) 500 MG tablet Take 500 mg by mouth every 6 (six) hours as needed (Pain).     No current facility-administered medications for this visit.     Allergies as of 01/30/2016  . (No Known Allergies)    Family History  Problem Relation Age of Onset  . Colon cancer Neg Hx     Social History   Social History  . Marital status: Married    Spouse name: N/A  . Number of children: N/A  . Years of education: N/A   Social History Main Topics  . Smoking status: Current Some Day Smoker    Packs/day: 0.50    Types: Cigarettes  . Smokeless tobacco: Never Used  . Alcohol use No  . Drug use: No  . Sexual activity: Not Asked    Other Topics Concern  . None   Social History Narrative  . None    Review of Systems: Negative unless mentioned in HPI.   Physical Exam: BP 123/78   Pulse 74   Temp 97.7 F (36.5 C) (Oral)   Ht 4\' 11"  (1.499 m)   Wt 158 lb 3.2 oz (71.8 kg)   BMI 31.95 kg/m  General:   Alert and oriented. No distress noted. Pleasant and cooperative.  Head:  Normocephalic and atraumatic. Eyes:  Conjuctiva clear without scleral icterus. Mouth:  Notable oral thrush to tongue  Heart:  S1, S2 present without murmurs Abdomen:  +BS, soft, non-tender and non-distended. No rebound or guarding. No HSM  or masses noted. Msk:  Symmetrical without gross deformities. Normal posture. Extremities:  Without edema. Neurologic:  Alert and  oriented x4;  grossly normal neurologically. Psych:  Alert and cooperative. Normal mood and affect.

## 2016-01-30 NOTE — Assessment & Plan Note (Addendum)
50 year old female with acute onset of dysphagia, odynophagia, and epigastric discomfort. She has notable oral thrush on physical exam. EGD on file from Feb 2017 and recent BPE completely negative. I discussed with her that her symptoms are stemming from candida, and if she does not improve with nystatin swish and swallow in the next week or so, we may need to proceed with EGD. However, I feel she will notice considerable improvement with starting therapy now. May ultimately need Diflucan but would pursue EGD for diagnostic purposes and evaluate esophagus first. Return in 6 weeks. Patient to call next week with update.

## 2016-02-16 ENCOUNTER — Other Ambulatory Visit: Payer: Self-pay | Admitting: Gastroenterology

## 2016-03-13 ENCOUNTER — Ambulatory Visit: Payer: BLUE CROSS/BLUE SHIELD | Admitting: Gastroenterology

## 2016-04-02 ENCOUNTER — Ambulatory Visit: Payer: BLUE CROSS/BLUE SHIELD | Admitting: Gastroenterology

## 2016-04-14 ENCOUNTER — Ambulatory Visit: Payer: BLUE CROSS/BLUE SHIELD | Admitting: Gastroenterology

## 2016-04-14 ENCOUNTER — Encounter: Payer: Self-pay | Admitting: Gastroenterology

## 2016-04-14 ENCOUNTER — Telehealth: Payer: Self-pay | Admitting: Gastroenterology

## 2016-04-14 NOTE — Telephone Encounter (Signed)
Patient was a no show and letter sent  °

## 2016-11-09 ENCOUNTER — Other Ambulatory Visit: Payer: Self-pay | Admitting: Nurse Practitioner

## 2016-12-15 ENCOUNTER — Encounter: Payer: Self-pay | Admitting: Obstetrics and Gynecology

## 2016-12-18 ENCOUNTER — Encounter: Payer: Self-pay | Admitting: Obstetrics and Gynecology

## 2017-01-08 ENCOUNTER — Encounter: Payer: Self-pay | Admitting: Obstetrics and Gynecology

## 2017-01-19 ENCOUNTER — Encounter: Payer: Self-pay | Admitting: Obstetrics and Gynecology

## 2017-02-04 ENCOUNTER — Encounter: Payer: Self-pay | Admitting: Obstetrics and Gynecology

## 2017-04-28 ENCOUNTER — Telehealth: Payer: Self-pay

## 2017-04-28 NOTE — Telephone Encounter (Signed)
PA request for Movantik was sent through covermy meds. PA was sent through covermymeds. Received a call 3 mins later from [facility], stating Pt didn't require a PA.

## 2017-05-10 ENCOUNTER — Emergency Department (HOSPITAL_COMMUNITY): Payer: BLUE CROSS/BLUE SHIELD

## 2017-05-10 ENCOUNTER — Encounter (HOSPITAL_COMMUNITY): Payer: Self-pay | Admitting: Emergency Medicine

## 2017-05-10 ENCOUNTER — Emergency Department (HOSPITAL_COMMUNITY)
Admission: EM | Admit: 2017-05-10 | Discharge: 2017-05-10 | Disposition: A | Discharge status: Home / Self Care | Payer: BLUE CROSS/BLUE SHIELD | Attending: Emergency Medicine | Admitting: Emergency Medicine

## 2017-05-10 ENCOUNTER — Other Ambulatory Visit: Payer: Self-pay

## 2017-05-10 DIAGNOSIS — R0789 Other chest pain: Secondary | ICD-10-CM | POA: Insufficient documentation

## 2017-05-10 DIAGNOSIS — I10 Essential (primary) hypertension: Secondary | ICD-10-CM | POA: Insufficient documentation

## 2017-05-10 DIAGNOSIS — K2970 Gastritis, unspecified, without bleeding: Secondary | ICD-10-CM | POA: Diagnosis not present

## 2017-05-10 DIAGNOSIS — Z794 Long term (current) use of insulin: Secondary | ICD-10-CM | POA: Insufficient documentation

## 2017-05-10 DIAGNOSIS — E119 Type 2 diabetes mellitus without complications: Secondary | ICD-10-CM | POA: Insufficient documentation

## 2017-05-10 DIAGNOSIS — E039 Hypothyroidism, unspecified: Secondary | ICD-10-CM | POA: Insufficient documentation

## 2017-05-10 DIAGNOSIS — Z79899 Other long term (current) drug therapy: Secondary | ICD-10-CM | POA: Diagnosis not present

## 2017-05-10 DIAGNOSIS — Z720 Tobacco use: Secondary | ICD-10-CM | POA: Insufficient documentation

## 2017-05-10 LAB — I-STAT TROPONIN, ED: Troponin i, poc: 0 ng/mL (ref 0.00–0.08)

## 2017-05-10 LAB — CBC
HCT: 41.7 % (ref 36.0–46.0)
Hemoglobin: 13.3 g/dL (ref 12.0–15.0)
MCH: 30.2 pg (ref 26.0–34.0)
MCHC: 31.9 g/dL (ref 30.0–36.0)
MCV: 94.6 fL (ref 78.0–100.0)
Platelets: 271 K/uL (ref 150–400)
RBC: 4.41 MIL/uL (ref 3.87–5.11)
RDW: 14.4 % (ref 11.5–15.5)
WBC: 4.7 K/uL (ref 4.0–10.5)

## 2017-05-10 LAB — COMPREHENSIVE METABOLIC PANEL
ALT: 30 U/L (ref 14–54)
AST: 56 U/L — ABNORMAL HIGH (ref 15–41)
Albumin: 4.1 g/dL (ref 3.5–5.0)
Alkaline Phosphatase: 44 U/L (ref 38–126)
Anion gap: 10 (ref 5–15)
BUN: 24 mg/dL — ABNORMAL HIGH (ref 6–20)
CHLORIDE: 103 mmol/L (ref 101–111)
CO2: 24 mmol/L (ref 22–32)
CREATININE: 0.73 mg/dL (ref 0.44–1.00)
Calcium: 9 mg/dL (ref 8.9–10.3)
GFR calc Af Amer: >60 mL/min (ref >60)
Glucose, Bld: 74 mg/dL (ref 65–99)
POTASSIUM: 3.7 mmol/L (ref 3.5–5.1)
Sodium: 137 mmol/L (ref 135–145)
Total Bilirubin: 0.3 mg/dL (ref 0.3–1.2)
Total Protein: 7 g/dL (ref 6.5–8.1)

## 2017-05-10 LAB — I-STAT CHEM 8, ED
BUN: 22 mg/dL — ABNORMAL HIGH (ref 6–20)
CHLORIDE: 103 mmol/L (ref 101–111)
CREATININE: 0.7 mg/dL (ref 0.44–1.00)
Calcium, Ion: 1.1 mmol/L — ABNORMAL LOW (ref 1.15–1.40)
GLUCOSE: 74 mg/dL (ref 65–99)
HCT: 41 % (ref 36.0–46.0)
HEMOGLOBIN: 13.9 g/dL (ref 12.0–15.0)
POTASSIUM: 3.8 mmol/L (ref 3.5–5.1)
Sodium: 140 mmol/L (ref 135–145)
TCO2: 25 mmol/L (ref 22–32)

## 2017-05-10 LAB — D-DIMER, QUANTITATIVE: D-Dimer, Quant: <0.27 ug/mL-FEU (ref 0.00–0.50)

## 2017-05-10 LAB — LIPASE, BLOOD: Lipase: 28 U/L (ref 11–51)

## 2017-05-10 LAB — I-STAT BETA HCG BLOOD, ED (MC, WL, AP ONLY): I-stat hCG, quantitative: <5 m[IU]/mL

## 2017-05-10 MED ORDER — GI COCKTAIL ~~LOC~~
30.0000 mL | Freq: Once | ORAL | Status: AC
  Administered 2017-05-10: 30 mL via ORAL
  Filled 2017-05-10: qty 30

## 2017-05-10 MED ORDER — TRAMADOL HCL 50 MG PO TABS: 50.0000 mg | ORAL_TABLET | Freq: Four times a day (QID) | ORAL | 0 refills | Status: DC | PRN

## 2017-05-10 MED ORDER — SUCRALFATE 1 G PO TABS: 1.0000 g | ORAL_TABLET | Freq: Three times a day (TID) | ORAL | 0 refills | Status: DC

## 2017-05-10 MED ORDER — TRAMADOL HCL 50 MG PO TABS
50.0000 mg | ORAL_TABLET | Freq: Once | ORAL | Status: AC
  Administered 2017-05-10: 50 mg via ORAL
  Filled 2017-05-10: qty 1

## 2017-05-10 NOTE — ED Notes (Signed)
Pt alert & oriented x4, stable gait. Patient given discharge instructions, paperwork & prescription(s). Patient  instructed to stop at the registration desk to finish any additional paperwork. Patient verbalized understanding. Pt left department w/ no further questions. 

## 2017-05-10 NOTE — ED Triage Notes (Addendum)
PT C/O chest pain that started last night. Pt states that she has taken 2 nitroglycerin at 10am and 1400 with no relief. Pt states she feels nauseated.

## 2017-05-10 NOTE — ED Provider Notes (Signed)
[facility] EMERGENCY DEPARTMENT Provider Note   CSN: [id number] Arrival date & time: 05/10/17  1936     History   Chief Complaint Chief Complaint  Patient presents with  . Chest Pain    HPI [name] is a 52 y.o. female.  HPI The patient states that while at work yesterday evening at 6 PM she bent down and then stood back up.  She developed immediate onset sharp central chest pain radiating to bilateral shoulders.  She states the pain has been persistent since onset.  She took 2 nitroglycerin without relief.  She has had a mild cough and nausea but no vomiting.  No fever or chills.  She also complains of several days of left-sided low back pain and left lower leg pain.  No known swelling.  No mobilizations or extended travel.  Patient states she is taking Plavix and aspirin as previously prescribed. Past Medical History:  Diagnosis Date  . Diabetes mellitus   . GERD (gastroesophageal reflux disease)   . H/O hiatal hernia   . High cholesterol   . History of kidney stones   . Hypertension   . Hypothyroidism   . MI (myocardial infarction) (HCC) 2015  . Neuromuscular disorder (HCC)    neuropathy; carpal tunnel BOTH WRISTS    Patient Active Problem List   Diagnosis Date Noted  . Oral candida 01/30/2016  . Abdominal hernia 05/30/2015  . Diabetes mellitus 05/07/2015  . Hypertension 05/07/2015  . High cholesterol 05/07/2015  . Hypothyroidism 05/07/2015  . Pain in the chest   . Chest pain 05/06/2015  . Abdominal pain 02/28/2015  . Constipation 02/28/2015    Past Surgical History:  Procedure Laterality Date  . CESAREAN SECTION     X 3  . CHOLECYSTECTOMY    . COLONOSCOPY  April 2016   Dr. Teena Dunk: diverticulum in ascending colon, small internal hemorrhoids,   . COLONOSCOPY  2013   Dr. Randa Evens: normal mucosa, internal hemorrhoids. path: benign colonic mucosa  . COLONOSCOPY WITH ESOPHAGOGASTRODUODENOSCOPY (EGD)     Dr. Randa Evens.   . CORONARY ANGIOPLASTY WITH STENT  PLACEMENT  2015   STENT X 1  . ESOPHAGOGASTRODUODENOSCOPY N/A 04/11/2015   Dr. Outlaw:Gastric nodule   . ESOPHAGOGASTRODUODENOSCOPY  2013   Dr. Randa Evens: normal esophagus. medium-sized submucosal mass with no bleeding and no stigmata of recent bleeding, s/p biopsy. path: benign small bowel mucosa, surgical polyp with polypoid reactive-type gastropathy, negative h.pylori   . EUS  11/18/2011   Dr. Dulce Sellar: gastric nodule, suspect benign leiomyoma or focal thicekning of muscularis propria with extension to the pyloric channel. Consider repeat EUS in 1-2 years.   . EUS N/A 04/11/2015   Dr. Dulce Sellar: 10X63mm nodule, no significant change in size since last EUS Sept 2013. no further surveillance needed   . INCISIONAL HERNIA REPAIR N/A 06/08/2015   Procedure: Sherald Hess HERNIORRHAPHY WITH MESH;  Surgeon: Franky Macho, MD;  Location: [facility];  Service: General;  Laterality: N/A;  . INSERTION OF MESH N/A 06/08/2015   Procedure: INSERTION OF MESH;  Surgeon: Franky Macho, MD;  Location: [facility];  Service: General;  Laterality: N/A;  . UTERINE ABLATION      OB History    No data available       Home Medications    Prior to Admission medications   Medication Sig Start Date End Date Taking? Authorizing Provider  albuterol (PROAIR HFA) 108 (90 Base) MCG/ACT inhaler Inhale 2 puffs into the lungs every 4 (four) hours as needed. 05/07/15  Yes [provider]  aspirin EC 81 MG tablet Take 81 mg by mouth daily.   Yes [provider]  atorvastatin (LIPITOR) 40 MG tablet Take 40 mg by mouth every evening.   Yes [provider]  Cholecalciferol 4000 units TABS Take 1 tablet by mouth daily.   Yes [provider]  clopidogrel (PLAVIX) 75 MG tablet Take 75 mg by mouth daily. Reported on 02/28/2015   Yes [provider]  fenofibrate (TRICOR) 145 MG tablet Take 145 mg by mouth daily. 05/05/17  Yes [provider]  furosemide (LASIX) 40 MG tablet Take 40 mg by mouth daily.     Yes [provider]  gabapentin (NEURONTIN) 300 MG capsule Take 300 mg by mouth 2 (two) times daily.   Yes [provider]  Insulin Aspart (NOVOLOG [place]) Inject 0-16 Units into the skin 3 (three) times daily as needed (Using according to sliding scale at home.). Sliding  scale   Yes [provider]  Insulin Degludec (TRESIBA FLEXTOUCH) 200 UNIT/ML SOPN Inject 20 Units into the skin daily as needed.   Yes [provider]  INVOKANA 100 MG TABS tablet Take 100 mg by mouth daily. 05/24/15  Yes [provider]  levothyroxine (SYNTHROID, LEVOTHROID) 75 MCG tablet Take 75 mcg by mouth daily before breakfast. Reported on 04/23/2015   Yes [provider]  metFORMIN (GLUCOPHAGE) 1000 MG tablet Take 1,000 mg by mouth 2 (two) times daily with a meal.    Yes [provider]  naloxegol oxalate (MOVANTIK) 25 MG TABS tablet Take 1 tablet (25 mg total) by mouth daily. 1 hour before breakfast 04/23/15  Yes Gelene Mink, NP  nitroGLYCERIN (NITROSTAT) 0.4 MG SL tablet Place 0.4 mg under the tongue as needed. 12/25/13  Yes [provider]  omeprazole (PRILOSEC) 40 MG capsule Take 40 mg by mouth daily.   Yes [provider]  Oxycodone HCl 10 MG TABS Take 1 tablet (10 mg total) by mouth every 4 (four) hours as needed (Pain). Patient taking differently: Take 15 mg by mouth every 4 (four) hours as needed (Pain).  06/08/15  Yes Franky Macho, MD  sucralfate (CARAFATE) 1 g tablet Take 1 tablet (1 g total) by mouth 4 (four) times daily -  with meals and at bedtime. 05/10/17   Loren Racer, MD  traMADol (ULTRAM) 50 MG tablet Take 1 tablet (50 mg total) by mouth every 6 (six) hours as needed for moderate pain or severe pain. 05/10/17   Loren Racer, MD    Family History Family History  Problem Relation Age of Onset  . Colon cancer Neg Hx     Social History Social History   Tobacco Use  . Smoking status: Current Some Day Smoker     Packs/day: 0.50    Types: Cigarettes  . Smokeless tobacco: Never Used  Substance Use Topics  . Alcohol use: No    Alcohol/week: 0.0 oz  . Drug use: No     Allergies   Patient has no known allergies.   Review of Systems Review of Systems  Constitutional: Negative for chills and fever.  HENT: Negative for sore throat and trouble swallowing.   Eyes: Negative for visual disturbance.  Respiratory: Positive for cough. Negative for shortness of breath.   Cardiovascular: Positive for chest pain and leg swelling. Negative for palpitations.  Gastrointestinal: Positive for abdominal pain and nausea. Negative for constipation, diarrhea and vomiting.  Genitourinary: Negative for difficulty urinating, dysuria, flank pain and  frequency.  Musculoskeletal: Positive for back pain and myalgias. Negative for arthralgias and neck pain.  Skin: Negative for rash and wound.  Neurological: Negative for dizziness, weakness, light-headedness, numbness and headaches.  All other systems reviewed and are negative.    Physical Exam Updated Vital Signs BP 111/60   Pulse 60   Temp 98.6 F (37 C) (Oral)   Resp 14   Wt 71.7 kg (158 lb)   SpO2 95%   BMI 31.91 kg/m   Physical Exam  Constitutional: She is oriented to person, place, and time. She appears well-developed and well-nourished. No distress.  HENT:  Head: Normocephalic and atraumatic.  Mouth/Throat: Oropharynx is clear and moist. No oropharyngeal exudate.  Eyes: EOM are normal. Pupils are equal, round, and reactive to light.  Neck: Normal range of motion. Neck supple.  Cardiovascular: Normal rate and regular rhythm. Exam reveals no gallop and no friction rub.  No murmur heard. Pulmonary/Chest: Effort normal and breath sounds normal. No stridor. No respiratory distress. She has no wheezes. She has no rales. She exhibits tenderness.  Patient's chest pain is reproduced with lower sternal palpation especially over the xiphoid process.  No  crepitance or deformity.  Abdominal: Soft. Bowel sounds are normal. There is tenderness. There is no rebound and no guarding.  Epigastric tenderness to palpation.  Musculoskeletal: Normal range of motion. She exhibits no edema or tenderness.  Mild left lower lumbar paraspinal tenderness to palpation.  No CVA tenderness bilaterally.  Patient has mild left calf tenderness without obvious swelling.  Distal pulses intact.  Lymphadenopathy:    She has no cervical adenopathy.  Neurological: She is alert and oriented to person, place, and time.  Skin: Skin is warm and dry. Capillary refill takes less than 2 seconds. No rash noted. She is not diaphoretic. No erythema.  Psychiatric: She has a normal mood and affect. Her behavior is normal.  Nursing note and vitals reviewed.    ED Treatments / Results  Labs (all labs ordered are listed, but only abnormal results are displayed) Labs Reviewed  COMPREHENSIVE METABOLIC PANEL - Abnormal; Notable for the following components:      Result Value   BUN 24 (*)    AST 56 (*)    All other components within normal limits  I-STAT CHEM 8, ED - Abnormal; Notable for the following components:   BUN 22 (*)    Calcium, Ion 1.10 (*)    All other components within normal limits  CBC  LIPASE, BLOOD  D-DIMER, QUANTITATIVE (NOT AT [facility])  URINALYSIS, ROUTINE W REFLEX MICROSCOPIC  I-STAT TROPONIN, ED  I-STAT BETA HCG BLOOD, ED ([facility], [facility], [facility] ONLY)    EKG  EKG Interpretation  Date/Time:  Sunday May 10 2017 19:47:43 EST Ventricular Rate:  66 PR Interval:    QRS Duration: 86 QT Interval:  400 QTC Calculation: 420 R Axis:   52 Text Interpretation:  Sinus rhythm Low voltage, precordial leads Baseline wander in lead(s) V3 Confirmed by Loren Racer ([id number]) on 05/10/2017 8:10:44 PM       Radiology Dg Chest 2 View  Result Date: 05/10/2017 CLINICAL DATA:  Patient with chest pain. EXAM: CHEST  2 VIEW COMPARISON:  Chest radiograph 05/06/2015. FINDINGS:  Monitoring leads overlie the patient. Normal cardiac and mediastinal contours. No consolidative pulmonary opacities. Thoracic spine degenerative changes. IMPRESSION: No acute cardiopulmonary process. Electronically Signed   By: Annia Belt M.D.   On: 05/10/2017 20:55    Procedures Procedures (including critical care time)  Medications Ordered  in ED Medications  gi cocktail (Maalox,Lidocaine,Donnatal) (30 mLs Oral Given 05/10/17 2044)  traMADol (ULTRAM) tablet 50 mg (50 mg Oral Given 05/10/17 2222)     Initial Impression / Assessment and Plan / ED Course  I have reviewed the triage vital signs and the nursing notes.  Pertinent labs & imaging results that were available during my care of the patient were reviewed by me and considered in my medical decision making (see chart for details).     Patient states that her abdominal pain and nausea have resolved.  She still has mild tenderness over the xiphoid process.  Question costochondritis or cartilaginous injury.  EKG without ischemic findings.  Troponin is sufficient to rule out MI.  Normal d-dimer.  Patient is on 40 mg of Prilosec daily.  Will also start on Carafate.  Ultram as needed for chest wall pain.  Advised to follow-up with her gastroenterologist.  Strict return precautions have been given.  Final Clinical Impressions(s) / ED Diagnoses   Final diagnoses:  Chest wall pain  Gastritis without bleeding, unspecified chronicity, unspecified gastritis type    ED Discharge Orders        Ordered    sucralfate (CARAFATE) 1 g tablet  3 times daily with meals & bedtime     05/10/17 2205    traMADol (ULTRAM) 50 MG tablet  Every 6 hours PRN     05/10/17 2205       Loren Racer, MD 05/10/17 2233

## 2017-07-09 ENCOUNTER — Ambulatory Visit: Payer: BLUE CROSS/BLUE SHIELD | Admitting: Gastroenterology

## 2017-09-30 ENCOUNTER — Ambulatory Visit: Payer: BLUE CROSS/BLUE SHIELD | Admitting: Gastroenterology

## 2018-05-31 ENCOUNTER — Other Ambulatory Visit (HOSPITAL_COMMUNITY): Payer: Self-pay | Admitting: *Deleted

## 2018-05-31 DIAGNOSIS — R109 Unspecified abdominal pain: Secondary | ICD-10-CM

## 2018-06-02 ENCOUNTER — Ambulatory Visit (HOSPITAL_COMMUNITY)
Admission: RE | Admit: 2018-06-02 | Discharge: 2018-06-02 | Disposition: A | Discharge status: Home / Self Care | Payer: BLUE CROSS/BLUE SHIELD | Source: Ambulatory Visit | Attending: *Deleted | Admitting: *Deleted

## 2018-06-02 ENCOUNTER — Other Ambulatory Visit: Payer: Self-pay

## 2018-06-02 DIAGNOSIS — R109 Unspecified abdominal pain: Secondary | ICD-10-CM

## 2018-06-02 LAB — POCT I-STAT CREATININE: Creatinine, Ser: 0.7 mg/dL (ref 0.44–1.00)

## 2018-06-02 MED ORDER — IOHEXOL 300 MG/ML  SOLN
100.0000 mL | Freq: Once | INTRAMUSCULAR | Status: AC | PRN
  Administered 2018-06-02: 100 mL via INTRAVENOUS

## 2018-08-25 ENCOUNTER — Observation Stay (HOSPITAL_COMMUNITY)
Admission: EM | Admit: 2018-08-25 | Discharge: 2018-08-26 | Disposition: A | Discharge status: Home / Self Care | Payer: BC Managed Care – PPO | Attending: Internal Medicine | Admitting: Internal Medicine

## 2018-08-25 ENCOUNTER — Other Ambulatory Visit: Payer: Self-pay

## 2018-08-25 ENCOUNTER — Encounter (HOSPITAL_COMMUNITY): Payer: Self-pay | Admitting: Emergency Medicine

## 2018-08-25 ENCOUNTER — Emergency Department (HOSPITAL_COMMUNITY): Payer: BC Managed Care – PPO

## 2018-08-25 DIAGNOSIS — Z955 Presence of coronary angioplasty implant and graft: Secondary | ICD-10-CM | POA: Insufficient documentation

## 2018-08-25 DIAGNOSIS — Z7982 Long term (current) use of aspirin: Secondary | ICD-10-CM | POA: Diagnosis not present

## 2018-08-25 DIAGNOSIS — Z7902 Long term (current) use of antithrombotics/antiplatelets: Secondary | ICD-10-CM | POA: Diagnosis not present

## 2018-08-25 DIAGNOSIS — E119 Type 2 diabetes mellitus without complications: Secondary | ICD-10-CM | POA: Diagnosis not present

## 2018-08-25 DIAGNOSIS — R072 Precordial pain: Secondary | ICD-10-CM | POA: Diagnosis not present

## 2018-08-25 DIAGNOSIS — Z79899 Other long term (current) drug therapy: Secondary | ICD-10-CM | POA: Insufficient documentation

## 2018-08-25 DIAGNOSIS — I2510 Atherosclerotic heart disease of native coronary artery without angina pectoris: Secondary | ICD-10-CM | POA: Diagnosis not present

## 2018-08-25 DIAGNOSIS — I252 Old myocardial infarction: Secondary | ICD-10-CM | POA: Insufficient documentation

## 2018-08-25 DIAGNOSIS — E7800 Pure hypercholesterolemia, unspecified: Secondary | ICD-10-CM | POA: Diagnosis not present

## 2018-08-25 DIAGNOSIS — R0789 Other chest pain: Secondary | ICD-10-CM

## 2018-08-25 DIAGNOSIS — Z9049 Acquired absence of other specified parts of digestive tract: Secondary | ICD-10-CM | POA: Insufficient documentation

## 2018-08-25 DIAGNOSIS — I10 Essential (primary) hypertension: Secondary | ICD-10-CM | POA: Diagnosis present

## 2018-08-25 DIAGNOSIS — E039 Hypothyroidism, unspecified: Secondary | ICD-10-CM | POA: Diagnosis not present

## 2018-08-25 DIAGNOSIS — R079 Chest pain, unspecified: Secondary | ICD-10-CM | POA: Diagnosis present

## 2018-08-25 DIAGNOSIS — I25110 Atherosclerotic heart disease of native coronary artery with unstable angina pectoris: Secondary | ICD-10-CM | POA: Diagnosis not present

## 2018-08-25 DIAGNOSIS — Z03818 Encounter for observation for suspected exposure to other biological agents ruled out: Secondary | ICD-10-CM | POA: Diagnosis not present

## 2018-08-25 DIAGNOSIS — Z794 Long term (current) use of insulin: Secondary | ICD-10-CM | POA: Diagnosis not present

## 2018-08-25 LAB — BASIC METABOLIC PANEL
Anion gap: 14 (ref 5–15)
BUN: 19 mg/dL (ref 6–20)
CO2: 26 mmol/L (ref 22–32)
Calcium: 8.9 mg/dL (ref 8.9–10.3)
Chloride: 100 mmol/L (ref 98–111)
Creatinine, Ser: 0.75 mg/dL (ref 0.44–1.00)
GFR calc Af Amer: >60 mL/min (ref >60)
GFR calc non Af Amer: >60 mL/min (ref >60)
Glucose, Bld: 135 mg/dL — ABNORMAL HIGH (ref 70–99)
Potassium: 4.1 mmol/L (ref 3.5–5.1)
Sodium: 140 mmol/L (ref 135–145)

## 2018-08-25 LAB — CBC
HCT: 46.5 % — ABNORMAL HIGH (ref 36.0–46.0)
Hemoglobin: 15.3 g/dL — ABNORMAL HIGH (ref 12.0–15.0)
MCH: 31.2 pg (ref 26.0–34.0)
MCHC: 32.9 g/dL (ref 30.0–36.0)
MCV: 94.7 fL (ref 80.0–100.0)
Platelets: 232 10*3/uL (ref 150–400)
RBC: 4.91 MIL/uL (ref 3.87–5.11)
RDW: 13.4 % (ref 11.5–15.5)
WBC: 7.5 10*3/uL (ref 4.0–10.5)
nRBC: 0 % (ref 0.0–0.2)

## 2018-08-25 LAB — POCT PREGNANCY, URINE: Preg Test, Ur: NEGATIVE

## 2018-08-25 LAB — TROPONIN I
Troponin I: <0.03 ng/mL (ref <0.03)
Troponin I: <0.03 ng/mL (ref <0.03)

## 2018-08-25 LAB — GLUCOSE, CAPILLARY: Glucose-Capillary: 106 mg/dL — ABNORMAL HIGH (ref 70–99)

## 2018-08-25 MED ORDER — GABAPENTIN 300 MG PO CAPS
300.0000 mg | ORAL_CAPSULE | Freq: Two times a day (BID) | ORAL | Status: DC
  Administered 2018-08-25: 300 mg via ORAL
  Filled 2018-08-25: qty 1

## 2018-08-25 MED ORDER — OXYCODONE HCL 5 MG PO TABS
15.0000 mg | ORAL_TABLET | Freq: Every day | ORAL | Status: DC
  Administered 2018-08-25 – 2018-08-26 (×2): 15 mg via ORAL
  Filled 2018-08-25 (×2): qty 3

## 2018-08-25 MED ORDER — ASPIRIN EC 81 MG PO TBEC: 81.0000 mg | DELAYED_RELEASE_TABLET | Freq: Every day | ORAL | Status: DC

## 2018-08-25 MED ORDER — ACETAMINOPHEN 325 MG PO TABS
650.0000 mg | ORAL_TABLET | Freq: Once | ORAL | Status: AC
  Administered 2018-08-25: 650 mg via ORAL
  Filled 2018-08-25: qty 2

## 2018-08-25 MED ORDER — ALUM & MAG HYDROXIDE-SIMETH 200-200-20 MG/5ML PO SUSP
30.0000 mL | Freq: Once | ORAL | Status: AC
  Administered 2018-08-25: 20:00:00 30 mL via ORAL
  Filled 2018-08-25: qty 30

## 2018-08-25 MED ORDER — FUROSEMIDE 40 MG PO TABS: 40.0000 mg | ORAL_TABLET | Freq: Every day | ORAL | Status: DC

## 2018-08-25 MED ORDER — ONDANSETRON HCL 4 MG/2ML IJ SOLN: 4.0000 mg | Freq: Four times a day (QID) | INTRAMUSCULAR | Status: DC | PRN

## 2018-08-25 MED ORDER — ASPIRIN 81 MG PO CHEW
162.0000 mg | CHEWABLE_TABLET | Freq: Once | ORAL | Status: AC
  Administered 2018-08-25: 162 mg via ORAL
  Filled 2018-08-25: qty 2

## 2018-08-25 MED ORDER — NITROGLYCERIN 0.4 MG SL SUBL: 0.4000 mg | SUBLINGUAL_TABLET | SUBLINGUAL | Status: DC | PRN

## 2018-08-25 MED ORDER — ACETAMINOPHEN 325 MG PO TABS: 650.0000 mg | ORAL_TABLET | ORAL | Status: DC | PRN

## 2018-08-25 MED ORDER — LIRAGLUTIDE 18 MG/3ML ~~LOC~~ SOPN
1.8000 mg | PEN_INJECTOR | Freq: Every day | SUBCUTANEOUS | Status: DC
  Filled 2018-08-25: qty 3

## 2018-08-25 MED ORDER — PANTOPRAZOLE SODIUM 40 MG PO TBEC: 80.0000 mg | DELAYED_RELEASE_TABLET | Freq: Every day | ORAL | Status: DC

## 2018-08-25 MED ORDER — CANAGLIFLOZIN 100 MG PO TABS
100.0000 mg | ORAL_TABLET | Freq: Every day | ORAL | Status: DC
  Filled 2018-08-25 (×2): qty 1

## 2018-08-25 MED ORDER — INSULIN ASPART 100 UNIT/ML ~~LOC~~ SOLN
0.0000 [IU] | Freq: Three times a day (TID) | SUBCUTANEOUS | Status: DC
  Administered 2018-08-26: 09:00:00 1 [IU] via SUBCUTANEOUS

## 2018-08-25 MED ORDER — LIDOCAINE VISCOUS HCL 2 % MT SOLN
15.0000 mL | Freq: Once | OROMUCOSAL | Status: AC
  Administered 2018-08-25: 15 mL via ORAL
  Filled 2018-08-25: qty 15

## 2018-08-25 MED ORDER — SODIUM CHLORIDE 0.9% FLUSH
3.0000 mL | Freq: Once | INTRAVENOUS | Status: AC
  Administered 2018-08-25: 3 mL via INTRAVENOUS

## 2018-08-25 MED ORDER — CLOPIDOGREL BISULFATE 75 MG PO TABS: 75.0000 mg | ORAL_TABLET | Freq: Every day | ORAL | Status: DC

## 2018-08-25 MED ORDER — ALBUTEROL SULFATE (2.5 MG/3ML) 0.083% IN NEBU: 2.5000 mg | INHALATION_SOLUTION | RESPIRATORY_TRACT | Status: DC | PRN

## 2018-08-25 NOTE — ED Triage Notes (Signed)
Patient reports onset of chest pain this am at 0400, radiation to back and neck. Patient reports prior history of MI with stent placement.

## 2018-08-25 NOTE — ED Provider Notes (Signed)
[facility] EMERGENCY DEPARTMENT Provider Note   CSN: [id number] Arrival date & time: 08/25/18  1253    History   Chief Complaint Chief Complaint  Patient presents with  . Chest Pain    HPI [name] is a 53 y.o. female with past medical history of MI with stent placement, GERD, diabetes, hypertension, hyperlipidemia, resenting to the emergency department with complaint of sudden onset of left-sided sharp chest pain that began yesterday evening around 4 PM when she was walking through her house.  She states the pain has become more of a dull aching pain though it is constant over the night.  She has some associated mild shortness of breath with it, as well as nausea and diaphoresis.  She states her neck feels discomfort as well with some radiation into her back. The neck pain feels just like it did when she had her previous MI. She took 81 mg of aspirin this morning, as well as 3 tabs of nitroglycerin without any relief.  She does have drug-eluting stents placed and is on Plavix.  [facility].     The history is provided by the patient.    Past Medical History:  Diagnosis Date  . Diabetes mellitus   . GERD (gastroesophageal reflux disease)   . H/O hiatal hernia   . High cholesterol   . History of kidney stones   . Hypertension   . Hypothyroidism   . MI (myocardial infarction) (HCC) 2015  . Neuromuscular disorder (HCC)    neuropathy; carpal tunnel BOTH WRISTS    Patient Active Problem List   Diagnosis Date Noted  . Oral candida 01/30/2016  . Abdominal hernia 05/30/2015  . Diabetes mellitus 05/07/2015  . Hypertension 05/07/2015  . High cholesterol 05/07/2015  . Hypothyroidism 05/07/2015  . Pain in the chest   . Chest pain 05/06/2015  . Abdominal pain 02/28/2015  . Constipation 02/28/2015    Past Surgical History:  Procedure Laterality Date  . CESAREAN SECTION     X 3  . CHOLECYSTECTOMY    . COLONOSCOPY  April 2016   Dr. Teena DunkBenson:  diverticulum in ascending colon, small internal hemorrhoids,   . COLONOSCOPY  2013   Dr. Randa EvensEdwards: normal mucosa, internal hemorrhoids. path: benign colonic mucosa  . COLONOSCOPY WITH ESOPHAGOGASTRODUODENOSCOPY (EGD)     Dr. Randa EvensEdwards.   . CORONARY ANGIOPLASTY WITH STENT PLACEMENT  2015   STENT X 1  . ESOPHAGOGASTRODUODENOSCOPY N/A 04/11/2015   Dr. Outlaw:Gastric nodule   . ESOPHAGOGASTRODUODENOSCOPY  2013   Dr. Randa EvensEdwards: normal esophagus. medium-sized submucosal mass with no bleeding and no stigmata of recent bleeding, s/p biopsy. path: benign small bowel mucosa, surgical polyp with polypoid reactive-type gastropathy, negative h.pylori   . EUS  11/18/2011   Dr. Dulce Sellarutlaw: gastric nodule, suspect benign leiomyoma or focal thicekning of muscularis propria with extension to the pyloric channel. Consider repeat EUS in 1-2 years.   . EUS N/A 04/11/2015   Dr. Dulce Sellarutlaw: 10X212mm nodule, no significant change in size since last EUS Sept 2013. no further surveillance needed   . INCISIONAL HERNIA REPAIR N/A 06/08/2015   Procedure: Sherald HessINCISIONAL HERNIORRHAPHY WITH MESH;  Surgeon: Franky MachoMark Jenkins, MD;  Location: [facility] ORS;  Service: General;  Laterality: N/A;  . INSERTION OF MESH N/A 06/08/2015   Procedure: INSERTION OF MESH;  Surgeon: Franky MachoMark Jenkins, MD;  Location: [facility] ORS;  Service: General;  Laterality: N/A;  . UTERINE ABLATION       OB History    Rhett BannisterGravida  3   Para  3   Term  2   Preterm  1   AB      Living        SAB      TAB      Ectopic      Multiple      Live Births               Home Medications    Prior to Admission medications   Medication Sig Start Date End Date Taking? Authorizing Provider  albuterol (PROAIR HFA) 108 (90 Base) MCG/ACT inhaler Inhale 2 puffs into the lungs every 4 (four) hours as needed. 05/07/15  Yes [provider]  aspirin EC 81 MG tablet Take 81 mg by mouth daily.   Yes [provider]  Cholecalciferol 25 MCG (1000 UT) capsule Take 2,000 Units by  mouth daily.    Yes [provider]  clopidogrel (PLAVIX) 75 MG tablet Take 75 mg by mouth daily. Reported on 02/28/2015   Yes [provider]  furosemide (LASIX) 40 MG tablet Take 40 mg by mouth daily.    Yes [provider]  gabapentin (NEURONTIN) 300 MG capsule Take 300 mg by mouth 2 (two) times daily.   Yes [provider]  Insulin Aspart (NOVOLOG [place]) Inject 0-16 Units into the skin 3 (three) times daily as needed (Using according to sliding scale at home.). Sliding  scale   Yes [provider]  INVOKANA 100 MG TABS tablet Take 100 mg by mouth daily. 05/24/15  Yes [provider]  liraglutide (VICTOZA) 18 MG/3ML SOPN Inject 1.8 mg into the skin daily.   Yes [provider]  metFORMIN (GLUCOPHAGE) 1000 MG tablet Take 1,000 mg by mouth 2 (two) times daily with a meal.    Yes [provider]  nitroGLYCERIN (NITROSTAT) 0.4 MG SL tablet Place 0.4 mg under the tongue as needed. 12/25/13  Yes [provider]  omeprazole (PRILOSEC) 40 MG capsule Take 40 mg by mouth daily.   Yes [provider]  Oxycodone HCl 10 MG TABS Take 1 tablet (10 mg total) by mouth every 4 (four) hours as needed (Pain). Patient taking differently: Take 15 mg by mouth 5 (five) times daily.  06/08/15  Yes Aviva Signs, MD    Family History Family History  Problem Relation Age of Onset  . Colon cancer Neg Hx     Social History Social History   Tobacco Use  . Smoking status: Current Some Day Smoker    Packs/day: 0.50    Types: Cigarettes  . Smokeless tobacco: Never Used  Substance Use Topics  . Alcohol use: No    Alcohol/week: 0.0 standard drinks  . Drug use: No     Allergies   Patient has no known allergies.   Review of Systems Review of Systems  Constitutional: Positive for diaphoresis.  Respiratory: Positive for shortness of breath.   Cardiovascular: Positive for chest pain and leg swelling. Negative for  palpitations.  Gastrointestinal: Positive for nausea. Negative for abdominal pain.  All other systems reviewed and are negative.    Physical Exam Updated Vital Signs BP 121/68 (BP Location: Right Arm)   Pulse 65   Temp 97.9 F (36.6 C) (Oral)   Resp 15   SpO2 97%   Physical Exam Vitals signs and nursing note reviewed.  Constitutional:      General: She is not in acute distress.    Appearance: She is well-developed. She is not  ill-appearing.  HENT:     Head: Normocephalic and atraumatic.  Eyes:     Conjunctiva/sclera: Conjunctivae normal.  Neck:     Musculoskeletal: Normal range of motion and neck supple. No muscular tenderness.     Vascular: No carotid bruit.  Cardiovascular:     Rate and Rhythm: Normal rate and regular rhythm.     Pulses: Normal pulses.     Heart sounds: Normal heart sounds.  Pulmonary:     Effort: Pulmonary effort is normal. No respiratory distress.     Breath sounds: Rhonchi present.  Chest:     Chest wall: Tenderness present.  Abdominal:     General: Bowel sounds are normal.     Palpations: Abdomen is soft.     Tenderness: There is no abdominal tenderness. There is no guarding or rebound.  Musculoskeletal:     Right lower leg: No edema.     Left lower leg: No edema.  Skin:    General: Skin is warm.  Neurological:     Mental Status: She is alert.  Psychiatric:        Behavior: Behavior normal.      ED Treatments / Results  Labs (all labs ordered are listed, but only abnormal results are displayed) Labs Reviewed  BASIC METABOLIC PANEL - Abnormal; Notable for the following components:      Result Value   Glucose, Bld 135 (*)    All other components within normal limits  CBC - Abnormal; Notable for the following components:   Hemoglobin 15.3 (*)    HCT 46.5 (*)    All other components within normal limits  TROPONIN I  POC URINE PREG, ED  POCT PREGNANCY, URINE    EKG EKG Interpretation  Date/Time:  Wednesday August 25 2018  13:04:13 EDT Ventricular Rate:  63 PR Interval:  150 QRS Duration: 86 QT Interval:  416 QTC Calculation: 425 R Axis:   54 Text Interpretation:  Normal sinus rhythm Low voltage QRS Borderline ECG Confirmed by Vanetta Mulders [phone]) on 08/25/2018 1:38:48 PM   Radiology Dg Chest 2 View  Result Date: 08/25/2018 CLINICAL DATA:  Chest pain EXAM: CHEST - 2 VIEW COMPARISON:  Chest CT 05/12/2018.  Chest x-ray 05/12/2018. FINDINGS: The lungs are clear without focal pneumonia, edema, pneumothorax or pleural effusion. Interstitial markings are diffusely coarsened with chronic features. The cardiopericardial silhouette is within normal limits for size. The visualized bony structures of the thorax are intact. Telemetry leads overlie the chest. IMPRESSION: No active cardiopulmonary disease. Electronically Signed   By: Kennith Center M.D.   On: 08/25/2018 15:49   Dg Foot Complete Right  Result Date: 08/25/2018 CLINICAL DATA:  Dropped heavy object on foot a few weeks ago. Persistent pain and swelling. EXAM: RIGHT FOOT COMPLETE - 3+ VIEW COMPARISON:  None. FINDINGS: The joint spaces are maintained. No acute fracture is identified. No radiopaque foreign body. Calcaneal spurring changes are noted. Mild pes cavus. IMPRESSION: No acute bony findings or significant degenerative changes. Electronically Signed   By: Rudie Meyer M.D.   On: 08/25/2018 15:51    Procedures Procedures (including critical care time)  Medications Ordered in ED Medications  sodium chloride flush (NS) 0.9 % injection 3 mL (has no administration in time range)  acetaminophen (TYLENOL) tablet 650 mg (has no administration in time range)  aspirin chewable tablet 162 mg (162 mg Oral Given 08/25/18 1425)     Initial Impression / Assessment and Plan / ED Course  I have reviewed the triage  vital signs and the nursing notes.  Pertinent labs & imaging results that were available during my care of the patient were reviewed by me and  considered in my medical decision making (see chart for details).  Clinical Course as of Aug 24 1705  Wed Aug 25, 2018  1517 Patient reevaluated.  Reports improvement in chest pain.  Discussed recommendations for admission.  Pending chest x-ray.   [JR]    Clinical Course User Index [JR] , [place] N, PA-C       Patient presenting with chest pain that began yesterday evening, constant throughout the night.  Initially sharp, now described as a tightness radiating to her neck, back, arm.  She has some associated shortness of breath, nausea, diaphoresis.  She did at home with 81 mg of aspirin and 3 tabs of nitro without relief.  Symptoms are improving on evaluation.  History of MI with stents in place.  States her pain in her neck feels very similar to prior MI, as this is where her symptoms were.  Work-up today reveals a normal troponin, no new changes on EKG.  Symptoms improving after additional dose of aspirin, will dose again with Tylenol.  At this time will admit to hospitalist for chest pain rule out, as patient with high heart score and elevated risk with prior MI.   The patient appears reasonably stabilized for admission considering the current resources, flow, and capabilities available in the ED at this time, and I doubt any other [facility] requiring further screening and/or treatment in the ED prior to admission.   Final Clinical Impressions(s) / ED Diagnoses   Final diagnoses:  Precordial chest pain    ED Discharge Orders    None       , [place] N, PA-C 08/25/18 1707    Vanetta MuldersZackowski, Scott, MD 08/27/18 2307

## 2018-08-25 NOTE — H&P (Addendum)
History and Physical:    [name]   WNI:[id number] DOB: Sep 18, 1965 DOA: 08/25/2018  Referring MD/provider: PA [place] PCP: Octavio Graves, DO   Patient coming from: Home  Chief Complaint: Chest and neck pain  History of Present Illness:   [name] is an 53 y.o. female with past medical history significant for MI 5 years ago per her report requiring DES who has been on aspirin and Plavix since then was in her usual state of health until yesterday at 4 PM when she developed substernal chest pressure and also some posterior neck pain.  Patient denies any jaw pain or left shoulder or arm discomfort.  Patient notes the pressure has been present continuously since yesterday until today when she got some aspirin in the ED.  She notes she did take nitroglycerin x3 earlier today with no effect on either chest pain or her neck pain.  Patient notes that when she had her MI 5 years ago she had similar kind of neck pain that she is having now, although at that time she also had left shoulder and arm numbness which she is not having at present.  Patient admits to some dyspnea on exertion although it is not different from baseline.  Patient also admits to intermittent diaphoresis but she states she is menopausal and has been diaphoretic anyway.  Patient also admits to nausea since yesterday but no vomitus.  Patient denies palpitations, dizziness, syncope or presyncope.  Patient denies pleuritic chest pain, hemoptysis, fevers or chills.  ED Course:  The patient was treated with aspirin with improvement in her chest pain but no improvement in her neck pain.  She was given Tylenol for her neck pain with some improvement.  She was noted to have normal vital signs, normal EKG and a normal troponin.  ROS:   ROS   Review of Systems: General: No fever, chills, weight changes Skin: No rashes, lesions, wounds Endocrine: no heat/cold intolerance, no polyuria Respiratory: No cough,, shortness of  breath, hemoptysis Cardiovascular: No palpitations GI: No nausea, vomiting, diarrhea, constipation GU: No dysuria, increased frequency CNS: No numbness, dizziness, headache   Past Medical History:   Past Medical History:  Diagnosis Date  . Diabetes mellitus   . GERD (gastroesophageal reflux disease)   . H/O hiatal hernia   . High cholesterol   . History of kidney stones   . Hypertension   . Hypothyroidism   . MI (myocardial infarction) ([place]) 2015  . Neuromuscular disorder (HCC)    neuropathy; carpal tunnel BOTH WRISTS    Past Surgical History:   Past Surgical History:  Procedure Laterality Date  . CESAREAN SECTION     X 3  . CHOLECYSTECTOMY    . COLONOSCOPY  April 2016   Dr. Britta Mccreedy: diverticulum in ascending colon, small internal hemorrhoids,   . COLONOSCOPY  2013   Dr. Oletta Lamas: normal mucosa, internal hemorrhoids. path: benign colonic mucosa  . COLONOSCOPY WITH ESOPHAGOGASTRODUODENOSCOPY (EGD)     Dr. Oletta Lamas.   . CORONARY ANGIOPLASTY WITH STENT PLACEMENT  2015   STENT X 1  . ESOPHAGOGASTRODUODENOSCOPY N/A 04/11/2015   Dr. Outlaw:Gastric nodule   . ESOPHAGOGASTRODUODENOSCOPY  2013   Dr. Oletta Lamas: normal esophagus. medium-sized submucosal mass with no bleeding and no stigmata of recent bleeding, s/p biopsy. path: benign small bowel mucosa, surgical polyp with polypoid reactive-type gastropathy, negative h.pylori   . EUS  11/18/2011   Dr. Paulita Fujita: gastric nodule, suspect benign leiomyoma or focal thicekning of muscularis propria with  extension to the pyloric channel. Consider repeat EUS in 1-2 years.   . EUS N/A 04/11/2015   Dr. Dulce Sellarutlaw: 10X812mm nodule, no significant change in size since last EUS Sept 2013. no further surveillance needed   . INCISIONAL HERNIA REPAIR N/A 06/08/2015   Procedure: Sherald HessINCISIONAL HERNIORRHAPHY WITH MESH;  Surgeon: Franky MachoMark Jenkins, MD;  Location: [facility] OR[facility];  Service: General;  Laterality: N/A;  . INSERTION OF MESH N/A 06/08/2015   Procedure: INSERTION OF MESH;   Surgeon: Franky MachoMark Jenkins, MD;  Location: [facility] ORS;  Service: General;  Laterality: N/A;  . UTERINE ABLATION      Social History:   Social History   Socioeconomic History  . Marital status: Married    Spouse name: Not on file  . Number of children: Not on file  . Years of education: Not on file  . Highest education level: Not on file  Occupational History  . Not on file  Social Needs  . Financial resource strain: Not on file  . Food insecurity    Worry: Not on file    Inability: Not on file  . Transportation needs    Medical: Not on file    Non-medical: Not on file  Tobacco Use  . Smoking status: Current Some Day Smoker    Packs/day: 0.50    Types: Cigarettes  . Smokeless tobacco: Never Used  Substance and Sexual Activity  . Alcohol use: No    Alcohol/week: 0.0 standard drinks  . Drug use: No  . Sexual activity: Not on file  Lifestyle  . Physical activity    Days per week: Not on file    Minutes per session: Not on file  . Stress: Not on file  Relationships  . Social Musicianconnections    Talks on phone: Not on file    Gets together: Not on file    Attends religious service: Not on file    Active member of club or organization: Not on file    Attends meetings of clubs or organizations: Not on file    Relationship status: Not on file  . Intimate partner violence    Fear of current or ex partner: Not on file    Emotionally abused: Not on file    Physically abused: Not on file    Forced sexual activity: Not on file  Other Topics Concern  . Not on file  Social History Narrative  . Not on file    Allergies   Patient has no known allergies.  Family history:   Family History  Problem Relation Age of Onset  . Colon cancer Neg Hx     Current Medications:   Prior to Admission medications   Medication Sig Start Date End Date Taking? Authorizing Provider  albuterol (PROAIR HFA) 108 (90 Base) MCG/ACT inhaler Inhale 2 puffs into the lungs every 4 (four) hours as needed.  05/07/15  Yes [provider]  aspirin EC 81 MG tablet Take 81 mg by mouth daily.   Yes [provider]  Cholecalciferol 25 MCG (1000 UT) capsule Take 2,000 Units by mouth daily.    Yes [provider]  clopidogrel (PLAVIX) 75 MG tablet Take 75 mg by mouth daily. Reported on 02/28/2015   Yes [provider]  furosemide (LASIX) 40 MG tablet Take 40 mg by mouth daily.    Yes [provider]  gabapentin (NEURONTIN) 300 MG capsule Take 300 mg by mouth 2 (two) times daily.   Yes [provider]  Insulin Aspart (  NOVOLOG [place]) Inject 0-16 Units into the skin 3 (three) times daily as needed (Using according to sliding scale at home.). Sliding  scale   Yes [provider]  INVOKANA 100 MG TABS tablet Take 100 mg by mouth daily. 05/24/15  Yes [provider]  liraglutide (VICTOZA) 18 MG/3ML SOPN Inject 1.8 mg into the skin daily.   Yes [provider]  metFORMIN (GLUCOPHAGE) 1000 MG tablet Take 1,000 mg by mouth 2 (two) times daily with a meal.    Yes [provider]  nitroGLYCERIN (NITROSTAT) 0.4 MG SL tablet Place 0.4 mg under the tongue as needed. 12/25/13  Yes [provider]  omeprazole (PRILOSEC) 40 MG capsule Take 40 mg by mouth daily.   Yes [provider]  Oxycodone HCl 10 MG TABS Take 1 tablet (10 mg total) by mouth every 4 (four) hours as needed (Pain). Patient taking differently: Take 15 mg by mouth 5 (five) times daily.  06/08/15  Yes Franky Macho, MD    Physical Exam:   Vitals:   08/25/18 1309 08/25/18 1645 08/25/18 1700  BP: 121/68  130/69  Pulse: 65 (!) 57 (!) 58  Resp: 15 15 16   Temp: 97.9 F (36.6 C)    TempSrc: Oral    SpO2: 97% 99% 98%     Physical Exam: Blood pressure 130/69, pulse (!) 58, temperature 97.9 F (36.6 C), temperature source Oral, resp. rate 16, SpO2 98 %. Gen: Somewhat anxious appearing female lying at about 20 degrees in no acute distress. Eyes: Sclerae  anicteric. Conjunctiva mildly injected. Chest: Moderately good air entry bilaterally with no adventitious sounds.  CV: Distant, regular, no audible murmurs. Abdomen: NABS, soft, nondistended, nontender.  No rebound, no guarding. Extremities: No edema.  Skin: Warm and dry. No rashes, lesions or wounds. Neuro: Alert and oriented times 3; grossly nonfocal. Psych: Patient is cooperative, logical and coherent with appropriate mood and affect.  Data Review:    Labs: Basic Metabolic Panel: Recent Labs  Lab 08/25/18 1342  NA 140  K 4.1  CL 100  CO2 26  GLUCOSE 135*  BUN 19  CREATININE 0.75  CALCIUM 8.9   Liver Function Tests: No results for input(s): AST, ALT, ALKPHOS, BILITOT, PROT, ALBUMIN in the last 168 hours. No results for input(s): LIPASE, AMYLASE in the last 168 hours. No results for input(s): AMMONIA in the last 168 hours. CBC: Recent Labs  Lab 08/25/18 1342  WBC 7.5  HGB 15.3*  HCT 46.5*  MCV 94.7  PLT 232   Cardiac Enzymes: Recent Labs  Lab 08/25/18 1342  TROPONINI <0.03    BNP (last 3 results) No results for input(s): PROBNP in the last 8760 hours. CBG: No results for input(s): GLUCAP in the last 168 hours.  Urinalysis    Component Value Date/Time   COLORURINE YELLOW 06/15/2015 1133   APPEARANCEUR CLEAR 06/15/2015 1133   LABSPEC <1.005 (L) 06/15/2015 1133   PHURINE 5.0 06/15/2015 1133   GLUCOSEU >1000 (A) 06/15/2015 1133   HGBUR NEGATIVE 06/15/2015 1133   BILIRUBINUR NEGATIVE 06/15/2015 1133   KETONESUR NEGATIVE 06/15/2015 1133   PROTEINUR NEGATIVE 06/15/2015 1133   UROBILINOGEN 0.2 03/15/2011 0033   NITRITE NEGATIVE 06/15/2015 1133   LEUKOCYTESUR NEGATIVE 06/15/2015 1133      Radiographic Studies: Dg Chest 2 View  Result Date: 08/25/2018 CLINICAL DATA:  Chest pain EXAM: CHEST - 2 VIEW COMPARISON:  Chest CT 05/12/2018.  Chest x-ray 05/12/2018. FINDINGS: The lungs are clear without focal pneumonia, edema, pneumothorax or  pleural effusion.  Interstitial markings are diffusely coarsened with chronic features. The cardiopericardial silhouette is within normal limits for size. The visualized bony structures of the thorax are intact. Telemetry leads overlie the chest. IMPRESSION: No active cardiopulmonary disease. Electronically Signed   By: Kennith CenterEric  Mansell M.D.   On: 08/25/2018 15:49   Dg Foot Complete Right  Result Date: 08/25/2018 CLINICAL DATA:  Dropped heavy object on foot a few weeks ago. Persistent pain and swelling. EXAM: RIGHT FOOT COMPLETE - 3+ VIEW COMPARISON:  None. FINDINGS: The joint spaces are maintained. No acute fracture is identified. No radiopaque foreign body. Calcaneal spurring changes are noted. Mild pes cavus. IMPRESSION: No acute bony findings or significant degenerative changes. Electronically Signed   By: Rudie MeyerP.  Gallerani M.D.   On: 08/25/2018 15:51    EKG: Independently reviewed.  Sinus rhythm at 60.  Normal intervals.  Normal axis.  No ST-T wave changes.  Normal EKG.   Assessment/Plan:   Principal Problem:   Chest pain Active Problems:   Type 2 diabetes mellitus without complication, with long-term current use of insulin (HCC)   Hypertension   High cholesterol   Hypothyroidism   CAD (coronary artery disease)   53 year old female with known coronary artery disease status post MI and DES 5 years ago per her report presents with continuous chest pain and neck pain which is not responsive to nitro, for 24 hours with normal EKG and normal troponin.  Differential diagnosis here includes GERD versus cardiac ischemia.  CHEST PAIN Will admit patient for possible ACS although normal EKG and troponin after 24 hours of pain is somewhat reassuring.  Patient has been treated with aspirin in ED, will continue DAPT as per home meds. Patient's heart rate is 58, so will not add a beta-blocker. Stress echo requested, cardiology consultation requested. I have also increased patient's PPI dose to pantoprazole 80 mg daily as  chest pain may be GERD.  HYPERTENSION Continue Lasix which seems to be her only antihypertensive at present  DM Continue Invokana and liraglutide Hold metformin Sliding scale insulin AC at bedtime requested  CAD Continue patient's DAPT with aspirin and Plavix Patient does not appear to be on a beta-blocker likely because patient's resting heart rate is less than 60 Patient is not on a statin, this can be addressed if warranted during this hospital stay  GERD As noted above, of increased patient's PPI dose, if this is not helpful she can go back to her usual PPI dose at discharge.   Other information:   DVT prophylaxis: Lovenox ordered. Code Status: Full code. Family Communication: Patient states her family knows she is in house Disposition Plan: Home Consults called: Cardiology for consultation in the morning Admission status: Observation   The medical decision making is of moderate complexity, therefore this is a level 2 visit.  Horatio PelSrobona Tublu  [facility]  If 7PM-7AM, please contact night-coverage [email or web address] Password [email or web address] 08/25/2018, 5:21 PM

## 2018-08-26 ENCOUNTER — Telehealth: Payer: Self-pay

## 2018-08-26 DIAGNOSIS — R079 Chest pain, unspecified: Secondary | ICD-10-CM | POA: Diagnosis not present

## 2018-08-26 LAB — TROPONIN I
Troponin I: <0.03 ng/mL (ref <0.03)
Troponin I: <0.03 ng/mL (ref <0.03)

## 2018-08-26 LAB — GLUCOSE, CAPILLARY: Glucose-Capillary: 140 mg/dL — ABNORMAL HIGH (ref 70–99)

## 2018-08-26 LAB — HEMOGLOBIN A1C
Hgb A1c MFr Bld: 6.2 % — ABNORMAL HIGH (ref 4.8–5.6)
Mean Plasma Glucose: 131.24 mg/dL

## 2018-08-26 LAB — NOVEL CORONAVIRUS, NAA (HOSP ORDER, SEND-OUT TO REF LAB; TAT 18-24 HRS): SARS-CoV-2, NAA: NOT DETECTED

## 2018-08-26 NOTE — Telephone Encounter (Signed)
order plaed

## 2018-08-26 NOTE — Consult Note (Signed)
CARDIOLOGY CONSULT NOTE       Patient ID: [name] MRN: [id number] DOB/AGE: 1965/03/23 53 y.o.  Admit date: 08/25/2018 Referring Physician: Tat Primary Physician: Octavio Graves, DO Primary Cardiologist: New/ [facility] Reason for Consultation: Chest pain  Principal Problem:   Chest pain Active Problems:   Type 2 diabetes mellitus without complication, with long-term current use of insulin (HCC)   Hypertension   High cholesterol   Hypothyroidism   CAD (coronary artery disease)   HPI:  53 y.o. smoker with history of CAD Had stent DES 5 years ago Has not f/u with cardiologist in 3 years. Had atypical chest pain yesterday Had gotten up from floor after wrapping grandson's birthday present. ? Pulled muscle in back Had right sided neck pain Pain persisted for a couple hours. No change with nitro Not really like her SEMI 5 years ago Resolved spontaneously At [facility] R/O with normal ECG She had been walking with no angina prior to this episode This am no symptoms just mild bronchitic cough  ROS All other systems reviewed and negative except as noted above  Past Medical History:  Diagnosis Date  . Diabetes mellitus   . GERD (gastroesophageal reflux disease)   . H/O hiatal hernia   . High cholesterol   . History of kidney stones   . Hypertension   . Hypothyroidism   . MI (myocardial infarction) ([place]) 2015  . Neuromuscular disorder (HCC)    neuropathy; carpal tunnel BOTH WRISTS    Family History  Problem Relation Age of Onset  . Colon cancer Neg Hx     Social History   Socioeconomic History  . Marital status: Married    Spouse name: Not on file  . Number of children: Not on file  . Years of education: Not on file  . Highest education level: Not on file  Occupational History  . Not on file  Social Needs  . Financial resource strain: Not on file  . Food insecurity    Worry: Not on file    Inability: Not on file  . Transportation needs    Medical: Not on file   Non-medical: Not on file  Tobacco Use  . Smoking status: Current Some Day Smoker    Packs/day: 0.50    Types: Cigarettes  . Smokeless tobacco: Never Used  Substance and Sexual Activity  . Alcohol use: No    Alcohol/week: 0.0 standard drinks  . Drug use: No  . Sexual activity: Not on file  Lifestyle  . Physical activity    Days per week: Not on file    Minutes per session: Not on file  . Stress: Not on file  Relationships  . Social Herbalist on phone: Not on file    Gets together: Not on file    Attends religious service: Not on file    Active member of club or organization: Not on file    Attends meetings of clubs or organizations: Not on file    Relationship status: Not on file  . Intimate partner violence    Fear of current or ex partner: Not on file    Emotionally abused: Not on file    Physically abused: Not on file    Forced sexual activity: Not on file  Other Topics Concern  . Not on file  Social History Narrative  . Not on file    Past Surgical History:  Procedure Laterality Date  . CESAREAN SECTION     X  3  . CHOLECYSTECTOMY    . COLONOSCOPY  April 2016   Dr. Teena Dunk: diverticulum in ascending colon, small internal hemorrhoids,   . COLONOSCOPY  2013   Dr. Randa Evens: normal mucosa, internal hemorrhoids. path: benign colonic mucosa  . COLONOSCOPY WITH ESOPHAGOGASTRODUODENOSCOPY (EGD)     Dr. Randa Evens.   . CORONARY ANGIOPLASTY WITH STENT PLACEMENT  2015   STENT X 1  . ESOPHAGOGASTRODUODENOSCOPY N/A 04/11/2015   Dr. Outlaw:Gastric nodule   . ESOPHAGOGASTRODUODENOSCOPY  2013   Dr. Randa Evens: normal esophagus. medium-sized submucosal mass with no bleeding and no stigmata of recent bleeding, s/p biopsy. path: benign small bowel mucosa, surgical polyp with polypoid reactive-type gastropathy, negative h.pylori   . EUS  11/18/2011   Dr. Dulce Sellar: gastric nodule, suspect benign leiomyoma or focal thicekning of muscularis propria with extension to the pyloric channel.  Consider repeat EUS in 1-2 years.   . EUS N/A 04/11/2015   Dr. Dulce Sellar: 10X99mm nodule, no significant change in size since last EUS Sept 2013. no further surveillance needed   . INCISIONAL HERNIA REPAIR N/A 06/08/2015   Procedure: Sherald Hess HERNIORRHAPHY WITH MESH;  Surgeon: Franky Macho, MD;  Location: [facility];  Service: General;  Laterality: N/A;  . INSERTION OF MESH N/A 06/08/2015   Procedure: INSERTION OF MESH;  Surgeon: Franky Macho, MD;  Location: [facility];  Service: General;  Laterality: N/A;  . UTERINE ABLATION       . aspirin EC  81 mg Oral Daily  . canagliflozin  100 mg Oral Daily  . clopidogrel  75 mg Oral Daily  . furosemide  40 mg Oral Daily  . gabapentin  300 mg Oral BID  . insulin aspart  0-9 Units Subcutaneous TID WC  . oxyCODONE  15 mg Oral 5 X Daily  . pantoprazole  80 mg Oral Daily     Physical Exam: Blood pressure (!) 112/58, pulse (!) 55, temperature (!) 97.4 F (36.3 C), temperature source Oral, resp. rate 17, height 4\' 11"  (1.499 m), weight 70.7 kg, SpO2 95 %.    Affect appropriate Healthy:  appears stated age HEENT: normal Neck supple with no adenopathy JVP normal no bruits no thyromegaly Lungs clear with no wheezing and good diaphragmatic motion Heart:  S1/S2 no murmur, no rub, gallop or click PMI normal Abdomen: benighn, BS positve, no tenderness, no AAA no bruit.  No HSM or HJR Distal pulses intact with no bruits No edema Neuro non-focal Skin warm and dry No muscular weakness   Labs:   Lab Results  Component Value Date   WBC 7.5 08/25/2018   HGB 15.3 (H) 08/25/2018   HCT 46.5 (H) 08/25/2018   MCV 94.7 08/25/2018   PLT 232 08/25/2018    Recent Labs  Lab 08/25/18 1342  NA 140  K 4.1  CL 100  CO2 26  BUN 19  CREATININE 0.75  CALCIUM 8.9  GLUCOSE 135*   Lab Results  Component Value Date   TROPONINI <0.03 08/26/2018    Lab Results  Component Value Date   CHOL 138 05/07/2015   Lab Results  Component Value Date   HDL 24 (L)  05/07/2015   Lab Results  Component Value Date   LDLCALC 44 05/07/2015   Lab Results  Component Value Date   TRIG 348 (H) 05/07/2015   Lab Results  Component Value Date   CHOLHDL 5.8 05/07/2015   No results found for: LDLDIRECT    Radiology: Dg Chest 2 View  Result Date: 08/25/2018 CLINICAL DATA:  Chest pain EXAM: CHEST - 2 VIEW COMPARISON:  Chest CT 05/12/2018.  Chest x-ray 05/12/2018. FINDINGS: The lungs are clear without focal pneumonia, edema, pneumothorax or pleural effusion. Interstitial markings are diffusely coarsened with chronic features. The cardiopericardial silhouette is within normal limits for size. The visualized bony structures of the thorax are intact. Telemetry leads overlie the chest. IMPRESSION: No active cardiopulmonary disease. Electronically Signed   By: Kennith CenterEric  Mansell M.D.   On: 08/25/2018 15:49   Dg Foot Complete Right  Result Date: 08/25/2018 CLINICAL DATA:  Dropped heavy object on foot a few weeks ago. Persistent pain and swelling. EXAM: RIGHT FOOT COMPLETE - 3+ VIEW COMPARISON:  None. FINDINGS: The joint spaces are maintained. No acute fracture is identified. No radiopaque foreign body. Calcaneal spurring changes are noted. Mild pes cavus. IMPRESSION: No acute bony findings or significant degenerative changes. Electronically Signed   By: Rudie MeyerP.  Gallerani M.D.   On: 08/25/2018 15:51    EKG: NSR normal    ASSESSMENT AND PLAN:   Chest Pain :  Atypical Resolved ECG normal Troponin negative Ok to d/c home will arrange outpatient f/u with lexiscan myovue next week She has SL nitro at home  DM:  Discussed low carb diet.  Target hemoglobin A1c is 6.5 or less.  Continue current medications.  Lipids:  Needs f/u lipid and liver profile should be on statin  Signed: Charlton Hawseter Nishan 08/26/2018, 8:10 AM

## 2018-08-26 NOTE — Discharge Instructions (Signed)

## 2018-08-26 NOTE — Telephone Encounter (Signed)
-----   Message from Orinda Kenner sent at 08/26/2018  8:37 AM EDT ----- Can someone put in an order for this myoview please?  Thanks, Coralyn Mark ----- Message ----- From: Josue Hector, MD Sent: 08/26/2018   8:14 AM EDT To: Orinda Kenner  Needs outpatient lexiscan myoviow next week and f/u with [facility] doc/Pa after test

## 2018-08-27 LAB — HIV ANTIBODY (ROUTINE TESTING W REFLEX): HIV Screen 4th Generation wRfx: NONREACTIVE

## 2018-08-31 ENCOUNTER — Other Ambulatory Visit: Payer: Self-pay

## 2018-08-31 ENCOUNTER — Encounter (HOSPITAL_COMMUNITY)
Admission: RE | Admit: 2018-08-31 | Discharge: 2018-08-31 | Disposition: A | Discharge status: Home / Self Care | Payer: BC Managed Care – PPO | Source: Ambulatory Visit | Attending: Cardiovascular Disease | Admitting: Cardiovascular Disease

## 2018-08-31 ENCOUNTER — Encounter (HOSPITAL_BASED_OUTPATIENT_CLINIC_OR_DEPARTMENT_OTHER)
Admission: RE | Admit: 2018-08-31 | Discharge: 2018-08-31 | Disposition: A | Discharge status: Home / Self Care | Payer: BC Managed Care – PPO | Source: Ambulatory Visit | Attending: Cardiovascular Disease | Admitting: Cardiovascular Disease

## 2018-08-31 DIAGNOSIS — R079 Chest pain, unspecified: Secondary | ICD-10-CM | POA: Insufficient documentation

## 2018-08-31 LAB — NM MYOCAR MULTI W/SPECT W/WALL MOTION / EF
LV dias vol: 66 mL (ref 46–106)
LV sys vol: 24 mL
Peak HR: 82 {beats}/min
RATE: 0.31
Rest HR: 58 {beats}/min
SDS: 0
SRS: 0
SSS: 0
TID: 1.42

## 2018-08-31 MED ORDER — SODIUM CHLORIDE 0.9% FLUSH
INTRAVENOUS | Status: AC
  Administered 2018-08-31: 10 mL via INTRAVENOUS
  Filled 2018-08-31: qty 10

## 2018-08-31 MED ORDER — TECHNETIUM TC 99M TETROFOSMIN IV KIT
10.0000 | PACK | Freq: Once | INTRAVENOUS | Status: AC | PRN
  Administered 2018-08-31: 9.6 via INTRAVENOUS

## 2018-08-31 MED ORDER — TECHNETIUM TC 99M TETROFOSMIN IV KIT
30.0000 | PACK | Freq: Once | INTRAVENOUS | Status: AC | PRN
  Administered 2018-08-31: 31 via INTRAVENOUS

## 2018-08-31 MED ORDER — REGADENOSON 0.4 MG/5ML IV SOLN
INTRAVENOUS | Status: AC
  Administered 2018-08-31: 0.4 mg via INTRAVENOUS
  Filled 2018-08-31: qty 5

## 2018-09-05 NOTE — Discharge Summary (Signed)
[facility] Discharge Summary   Patient: [name] HKV:[id number]   PCP: Octavio Graves, DO DOB: 03-Oct-1965   Date of admission: 08/25/2018   Date of discharge: 08/26/2018     Discharge Diagnoses:   Principal Problem:   Chest pain Active Problems:   Type 2 diabetes mellitus without complication, with long-term current use of insulin (HCC)   Hypertension   High cholesterol   Hypothyroidism   CAD (coronary artery disease)   Admitted From: home Disposition:  Home   Recommendations for Outpatient Follow-up:  1. Please follow up with PCP in 1 week.   Follow-up Information    [facility] On 08/31/2018.   Why: Register at Radiology Dept at 9:15 am (Stress Test). Do not eat or drink after midnight.       Erma Heritage, PA-C On 09/09/2018.   Specialties: Physician Assistant, Cardiology Why: at 2:00 pm Contact information: [address] [place] [address] [phone]          Diet recommendation: cardiac diet  Activity: The patient is advised to gradually reintroduce usual activities,as tolerated .  Discharge Condition: good  Code Status: full code  History of present illness: As per the H and P dictated on admission, "[name] is an 53 y.o. female with past medical history significant for MI 5 years ago per her report requiring DES who has been on aspirin and Plavix since then was in her usual state of health until yesterday at 4 PM when she developed substernal chest pressure and also some posterior neck pain.  Patient denies any jaw pain or left shoulder or arm discomfort.  Patient notes the pressure has been present continuously since yesterday until today when she got some aspirin in the ED.  She notes she did take nitroglycerin x3 earlier today with no effect on either chest pain or her neck pain.  Patient notes that when she had her MI 5 years ago she had similar kind of neck pain that she is having now, although at that time she also had  left shoulder and arm numbness which she is not having at present.  Patient admits to some dyspnea on exertion although it is not different from baseline.  Patient also admits to intermittent diaphoresis but she states she is menopausal and has been diaphoretic anyway.  Patient also admits to nausea since yesterday but no vomitus.  Patient denies palpitations, dizziness, syncope or presyncope.  Patient denies pleuritic chest pain, hemoptysis, fevers or chills."  Hospital Course:  Summary of her active problems in the hospital is as following. CHEST PAIN Cardiology consulted.  Serial troponin normal putpt Stress test planned  No further work up needed inpatient  HYPERTENSION Continue Lasix which seems to be her only antihypertensive at present  DM Continue Invokana and liraglutide  CAD Continue patient's DAPT with aspirin and Plavix Patient does not appear to be on a beta-blocker likely because patient's resting heart rate is less than 60  GERD As noted above, of increased patient's PPI dose, if this is not helpful she can go back to her usual PPI dose at discharge.  Patient was ambulatory without any assistance. On the day of the discharge the patient's vitals were stable , and no other acute medical condition were reported by patient. the patient was felt safe to be discharge at Home with family.  Consultants: cardiology  Procedures: none  DISCHARGE MEDICATION: Allergies as of 08/26/2018   No Known Allergies     Medication  List    TAKE these medications   aspirin EC 81 MG tablet Take 81 mg by mouth daily.   Cholecalciferol 25 MCG (1000 UT) capsule Take 2,000 Units by mouth daily.   clopidogrel 75 MG tablet Commonly known as: PLAVIX Take 75 mg by mouth daily. Reported on 02/28/2015   furosemide 40 MG tablet Commonly known as: LASIX Take 40 mg by mouth daily.   gabapentin 300 MG capsule Commonly known as: NEURONTIN Take 300 mg by mouth 2 (two) times daily.    Invokana 100 MG Tabs tablet Generic drug: canagliflozin Take 100 mg by mouth daily.   metFORMIN 1000 MG tablet Commonly known as: GLUCOPHAGE Take 1,000 mg by mouth 2 (two) times daily with a meal.   nitroGLYCERIN 0.4 MG SL tablet Commonly known as: NITROSTAT Place 0.4 mg under the tongue as needed.   NOVOLOG [place] Inject 0-16 Units into the skin 3 (three) times daily as needed (Using according to sliding scale at home.). Sliding  scale   omeprazole 40 MG capsule Commonly known as: PRILOSEC Take 40 mg by mouth daily.   Oxycodone HCl 10 MG Tabs Take 1 tablet (10 mg total) by mouth every 4 (four) hours as needed (Pain). What changed:   how much to take  when to take this   ProAir HFA 108 (90 Base) MCG/ACT inhaler Generic drug: albuterol Inhale 2 puffs into the lungs every 4 (four) hours as needed.   Victoza 18 MG/3ML Sopn Generic drug: liraglutide Inject 1.8 mg into the skin daily.      No Known Allergies Discharge Instructions    Diet - low sodium heart healthy   Complete by: As directed    Discharge instructions   Complete by: As directed    It is important that you read the given instructions as well as go over your medication list with RN to help you understand your care after this hospitalization.  Discharge Instructions: Please follow-up with PCP in 1-2 weeks  Please request your primary care physician to go over all Hospital Tests and Procedure/Radiological results at the follow up. Please get all Hospital records sent to your PCP by signing hospital release before you go home.   Do not take more than prescribed Pain, Sleep and Anxiety Medications. You were cared for by a hospitalist during your hospital stay. If you have any questions about your discharge medications or the care you received while you were in the hospital after you are discharged, you can call the unit @UNIT @ you were admitted to and ask to speak with the hospitalist on call if the hospitalist  that took care of you is not available.  Once you are discharged, your primary care physician will handle any further medical issues. Please note that NO REFILLS for any discharge medications will be authorized once you are discharged, as it is imperative that you return to your primary care physician (or establish a relationship with a primary care physician if you do not have one) for your aftercare needs so that they can reassess your need for medications and monitor your lab values. You Must read complete instructions/literature along with all the possible adverse reactions/side effects for all the Medicines you take and that have been prescribed to you. Take any new Medicines after you have completely understood and accept all the possible adverse reactions/side effects. Wear Seat belts while driving. If you have smoked or chewed Tobacco in the last 2 yrs please stop smoking and/or stop any Recreational drug  use.  If you drink alcohol, please stop the use and do not drive, operating heavy machinery, perform activities at heights, swimming or participation in water activities or provide baby sitting services under influence.   Increase activity slowly   Complete by: As directed      Discharge Exam: Filed Weights   08/25/18 1852  Weight: 70.7 kg   Vitals:   08/26/18 0259 08/26/18 0530  BP: 112/64 (!) 112/58  Pulse: (!) 58 (!) 55  Resp: 18 17  Temp: 98 F (36.7 C) (!) 97.4 F (36.3 C)  SpO2: 97% 95%   General: Appear in no distress, no Rash; Oral Mucosa Clear, moist. no Abnormal Mass Or lumps Cardiovascular: S1 and S2 Present, no Murmur, Respiratory: normal respiratory effort, Bilateral Air entry present and Clear to Auscultation, no Crackles, no wheezes Abdomen: Bowel Sound present, Soft and no tenderness, no hernia Extremities: no Pedal edema, no calf tenderness Neurology: alert and oriented to time, place, and person affect appropriate. normal without focal findings, mental  status, speech normal, alert and oriented x3, PERLA, Motor strength 5/5 and symmetric and sensation grossly normal to light touch   The results of significant diagnostics from this hospitalization (including imaging, microbiology, ancillary and laboratory) are listed below for reference.    Significant Diagnostic Studies: Dg Chest 2 View  Result Date: 08/25/2018 CLINICAL DATA:  Chest pain EXAM: CHEST - 2 VIEW COMPARISON:  Chest CT 05/12/2018.  Chest x-ray 05/12/2018. FINDINGS: The lungs are clear without focal pneumonia, edema, pneumothorax or pleural effusion. Interstitial markings are diffusely coarsened with chronic features. The cardiopericardial silhouette is within normal limits for size. The visualized bony structures of the thorax are intact. Telemetry leads overlie the chest. IMPRESSION: No active cardiopulmonary disease. Electronically Signed   By: Kennith Center M.D.   On: 08/25/2018 15:49   Nm Myocar Multi W/spect W/wall Motion / Ef  Result Date: 08/31/2018  There was no ST segment deviation noted during stress.  The study is normal. No myocardial ischemia or scar.  This is a low risk study.  Nuclear stress EF: 64%.    Dg Foot Complete Right  Result Date: 08/25/2018 CLINICAL DATA:  Dropped heavy object on foot a few weeks ago. Persistent pain and swelling. EXAM: RIGHT FOOT COMPLETE - 3+ VIEW COMPARISON:  None. FINDINGS: The joint spaces are maintained. No acute fracture is identified. No radiopaque foreign body. Calcaneal spurring changes are noted. Mild pes cavus. IMPRESSION: No acute bony findings or significant degenerative changes. Electronically Signed   By: Rudie Meyer M.D.   On: 08/25/2018 15:51    Microbiology: No results found for this or any previous visit (from the past 240 hour(s)).   Labs: CBC: No results for input(s): WBC, NEUTROABS, HGB, HCT, MCV, PLT in the last 168 hours. Basic Metabolic Panel: No results for input(s): NA, K, CL, CO2, GLUCOSE, BUN,  CREATININE, CALCIUM, MG, PHOS in the last 168 hours. Liver Function Tests: No results for input(s): AST, ALT, ALKPHOS, BILITOT, PROT, ALBUMIN in the last 168 hours. No results for input(s): LIPASE, AMYLASE in the last 168 hours. No results for input(s): AMMONIA in the last 168 hours. Cardiac Enzymes: No results for input(s): CKTOTAL, CKMB, CKMBINDEX, TROPONINI in the last 168 hours. BNP (last 3 results) No results for input(s): BNP in the last 8760 hours. CBG: No results for input(s): GLUCAP in the last 168 hours. Time spent: 35 minutes  Signed:  Lynden Oxford  [facility] 08/26/2018

## 2018-09-09 ENCOUNTER — Ambulatory Visit: Payer: BC Managed Care – PPO | Admitting: Student

## 2018-10-06 ENCOUNTER — Ambulatory Visit: Payer: BC Managed Care – PPO | Admitting: Student

## 2019-09-06 ENCOUNTER — Ambulatory Visit (HOSPITAL_COMMUNITY)
Admission: RE | Admit: 2019-09-06 | Discharge: 2019-09-06 | Disposition: A | Discharge status: Home / Self Care | Payer: BC Managed Care – PPO | Source: Ambulatory Visit | Attending: Internal Medicine | Admitting: Internal Medicine

## 2019-09-06 ENCOUNTER — Other Ambulatory Visit (HOSPITAL_COMMUNITY): Payer: Self-pay | Admitting: Internal Medicine

## 2019-09-06 ENCOUNTER — Other Ambulatory Visit: Payer: Self-pay

## 2019-09-06 DIAGNOSIS — R109 Unspecified abdominal pain: Secondary | ICD-10-CM | POA: Diagnosis present

## 2019-09-06 DIAGNOSIS — R1012 Left upper quadrant pain: Secondary | ICD-10-CM

## 2019-09-08 ENCOUNTER — Telehealth: Payer: Self-pay | Admitting: Emergency Medicine

## 2019-09-08 NOTE — Telephone Encounter (Signed)
Pt called and wanted to know if she is able to make an ov with [place] even though it has been a while since she has been seen, its been about 3 years. I was not sure and notified the pt that someone from our office will call her to f/u

## 2019-09-08 NOTE — Telephone Encounter (Signed)
Pt called back to check if she needed a new referral to be seen. Checked with Darl Pikes and pt is an established pt. Apt is scheduled for 10/17/19 at 9:30 with [facility]. Pt is ok with this apt and will call back to see if there has been any cancellations.

## 2019-09-09 ENCOUNTER — Encounter (HOSPITAL_COMMUNITY): Payer: Self-pay | Admitting: *Deleted

## 2019-09-09 ENCOUNTER — Emergency Department (HOSPITAL_COMMUNITY)
Admission: EM | Admit: 2019-09-09 | Discharge: 2019-09-09 | Disposition: A | Discharge status: Home / Self Care | Payer: BC Managed Care – PPO | Attending: Emergency Medicine | Admitting: Emergency Medicine

## 2019-09-09 ENCOUNTER — Other Ambulatory Visit: Payer: Self-pay

## 2019-09-09 ENCOUNTER — Emergency Department (HOSPITAL_COMMUNITY): Payer: BC Managed Care – PPO

## 2019-09-09 DIAGNOSIS — I252 Old myocardial infarction: Secondary | ICD-10-CM | POA: Insufficient documentation

## 2019-09-09 DIAGNOSIS — Z79899 Other long term (current) drug therapy: Secondary | ICD-10-CM | POA: Diagnosis not present

## 2019-09-09 DIAGNOSIS — Z7982 Long term (current) use of aspirin: Secondary | ICD-10-CM | POA: Insufficient documentation

## 2019-09-09 DIAGNOSIS — R1013 Epigastric pain: Secondary | ICD-10-CM | POA: Insufficient documentation

## 2019-09-09 DIAGNOSIS — I2510 Atherosclerotic heart disease of native coronary artery without angina pectoris: Secondary | ICD-10-CM | POA: Diagnosis not present

## 2019-09-09 DIAGNOSIS — F17210 Nicotine dependence, cigarettes, uncomplicated: Secondary | ICD-10-CM | POA: Insufficient documentation

## 2019-09-09 DIAGNOSIS — Z794 Long term (current) use of insulin: Secondary | ICD-10-CM | POA: Diagnosis not present

## 2019-09-09 DIAGNOSIS — E119 Type 2 diabetes mellitus without complications: Secondary | ICD-10-CM | POA: Diagnosis not present

## 2019-09-09 DIAGNOSIS — E039 Hypothyroidism, unspecified: Secondary | ICD-10-CM | POA: Diagnosis not present

## 2019-09-09 DIAGNOSIS — I10 Essential (primary) hypertension: Secondary | ICD-10-CM | POA: Insufficient documentation

## 2019-09-09 DIAGNOSIS — Z955 Presence of coronary angioplasty implant and graft: Secondary | ICD-10-CM | POA: Insufficient documentation

## 2019-09-09 DIAGNOSIS — R109 Unspecified abdominal pain: Secondary | ICD-10-CM

## 2019-09-09 LAB — CBC
HCT: 42.8 % (ref 36.0–46.0)
Hemoglobin: 14.4 g/dL (ref 12.0–15.0)
MCH: 32 pg (ref 26.0–34.0)
MCHC: 33.6 g/dL (ref 30.0–36.0)
MCV: 95.1 fL (ref 80.0–100.0)
Platelets: 241 10*3/uL (ref 150–400)
RBC: 4.5 MIL/uL (ref 3.87–5.11)
RDW: 13.2 % (ref 11.5–15.5)
WBC: 9.1 10*3/uL (ref 4.0–10.5)
nRBC: 0 % (ref 0.0–0.2)

## 2019-09-09 LAB — URINALYSIS, ROUTINE W REFLEX MICROSCOPIC
Bacteria, UA: NONE SEEN
Bilirubin Urine: NEGATIVE
Glucose, UA: NEGATIVE mg/dL
Ketones, ur: NEGATIVE mg/dL
Leukocytes,Ua: NEGATIVE
Nitrite: NEGATIVE
Protein, ur: NEGATIVE mg/dL
Specific Gravity, Urine: 1.006 (ref 1.005–1.030)
pH: 5 (ref 5.0–8.0)

## 2019-09-09 LAB — COMPREHENSIVE METABOLIC PANEL
ALT: 36 U/L (ref 0–44)
AST: 67 U/L — ABNORMAL HIGH (ref 15–41)
Albumin: 3.9 g/dL (ref 3.5–5.0)
Alkaline Phosphatase: 57 U/L (ref 38–126)
Anion gap: 16 — ABNORMAL HIGH (ref 5–15)
BUN: 20 mg/dL (ref 6–20)
CO2: 24 mmol/L (ref 22–32)
Calcium: 9.2 mg/dL (ref 8.9–10.3)
Chloride: 95 mmol/L — ABNORMAL LOW (ref 98–111)
Creatinine, Ser: 0.64 mg/dL (ref 0.44–1.00)
GFR calc Af Amer: >60 mL/min (ref >60)
GFR calc non Af Amer: >60 mL/min (ref >60)
Glucose, Bld: 210 mg/dL — ABNORMAL HIGH (ref 70–99)
Potassium: 3.7 mmol/L (ref 3.5–5.1)
Sodium: 135 mmol/L (ref 135–145)
Total Bilirubin: 0.4 mg/dL (ref 0.3–1.2)
Total Protein: 7.3 g/dL (ref 6.5–8.1)

## 2019-09-09 LAB — LIPASE, BLOOD: Lipase: 21 U/L (ref 11–51)

## 2019-09-09 LAB — TROPONIN I (HIGH SENSITIVITY): Troponin I (High Sensitivity): 3 ng/L (ref <18)

## 2019-09-09 MED ORDER — IOHEXOL 300 MG/ML  SOLN
100.0000 mL | Freq: Once | INTRAMUSCULAR | Status: AC | PRN
  Administered 2019-09-09: 100 mL via INTRAVENOUS

## 2019-09-09 NOTE — ED Triage Notes (Signed)
Abdominal pain in epigastric region radiating into left flank area for 3 months intermittent.

## 2019-09-09 NOTE — ED Notes (Signed)
ED Provider at bedside. 

## 2019-09-09 NOTE — Telephone Encounter (Signed)
Added patient to cancellation list  °

## 2019-09-09 NOTE — ED Provider Notes (Signed)
[facility] EMERGENCY DEPARTMENT Provider Note   CSN: [id number] Arrival date & time: 09/09/19  0957     History Chief Complaint  Patient presents with   Abdominal Pain    [name] is a 54 y.o. female.   Abdominal Pain Pain location:  Epigastric, LUQ and L flank Pain quality: aching and bloating   Pain radiates to:  L flank Pain severity:  Moderate Onset quality:  Gradual Timing:  Intermittent Progression:  Waxing and waning Chronicity:  Chronic Context: eating   Relieved by:  Nothing Worsened by:  Eating Ineffective treatments:  Bowel activity, acetaminophen, antacids, liquids, OTC medications, NSAIDs and position changes Associated symptoms: anorexia, nausea and vomiting   Associated symptoms: no chest pain, no chills, no constipation, no cough, no diarrhea, no dysuria, no fever, no hematemesis, no hematuria and no shortness of breath        Past Medical History:  Diagnosis Date   Diabetes mellitus    GERD (gastroesophageal reflux disease)    H/O hiatal hernia    High cholesterol    History of kidney stones    Hypertension    Hypothyroidism    MI (myocardial infarction) (HCC) 2015   Neuromuscular disorder (HCC)    neuropathy; carpal tunnel BOTH WRISTS    Patient Active Problem List   Diagnosis Date Noted   CAD (coronary artery disease) 08/25/2018   Oral candida 01/30/2016   Abdominal hernia 05/30/2015   Type 2 diabetes mellitus without complication, with long-term current use of insulin (HCC) 05/07/2015   Hypertension 05/07/2015   High cholesterol 05/07/2015   Hypothyroidism 05/07/2015   Pain in the chest    Chest pain 05/06/2015   Abdominal pain 02/28/2015   Constipation 02/28/2015    Past Surgical History:  Procedure Laterality Date   CESAREAN SECTION     X 3   CHOLECYSTECTOMY     COLONOSCOPY  April 2016   Dr. Teena Dunk: diverticulum in ascending colon, small internal hemorrhoids,    COLONOSCOPY  2013   Dr.  Randa Evens: normal mucosa, internal hemorrhoids. path: benign colonic mucosa   COLONOSCOPY WITH ESOPHAGOGASTRODUODENOSCOPY (EGD)     Dr. Randa Evens.    CORONARY ANGIOPLASTY WITH STENT PLACEMENT  2015   STENT X 1   ESOPHAGOGASTRODUODENOSCOPY N/A 04/11/2015   Dr. Outlaw:Gastric nodule    ESOPHAGOGASTRODUODENOSCOPY  2013   Dr. Randa Evens: normal esophagus. medium-sized submucosal mass with no bleeding and no stigmata of recent bleeding, s/p biopsy. path: benign small bowel mucosa, surgical polyp with polypoid reactive-type gastropathy, negative h.pylori    EUS  11/18/2011   Dr. Dulce Sellar: gastric nodule, suspect benign leiomyoma or focal thicekning of muscularis propria with extension to the pyloric channel. Consider repeat EUS in 1-2 years.    EUS N/A 04/11/2015   Dr. Dulce Sellar: 10X16mm nodule, no significant change in size since last EUS Sept 2013. no further surveillance needed    INCISIONAL HERNIA REPAIR N/A 06/08/2015   Procedure: Sherald Hess HERNIORRHAPHY WITH MESH;  Surgeon: Franky Macho, MD;  Location: [facility];  Service: General;  Laterality: N/A;   INSERTION OF MESH N/A 06/08/2015   Procedure: INSERTION OF MESH;  Surgeon: Franky Macho, MD;  Location: [facility];  Service: General;  Laterality: N/A;   UTERINE ABLATION       OB History    Gravida  3   Para  3   Term  2   Preterm  1   AB      Living  SAB      TAB      Ectopic      Multiple      Live Births              Family History  Problem Relation Age of Onset   Colon cancer Neg Hx     Social History   Tobacco Use   Smoking status: Current Some Day Smoker    Packs/day: 0.50    Types: Cigarettes   Smokeless tobacco: Never Used  Vaping Use   Vaping Use: Never used  Substance Use Topics   Alcohol use: No    Alcohol/week: 0.0 standard drinks   Drug use: No    Home Medications Prior to Admission medications   Medication Sig Start Date End Date Taking? Authorizing Provider  albuterol (PROAIR HFA) 108  (90 Base) MCG/ACT inhaler Inhale 2 puffs into the lungs every 4 (four) hours as needed. 05/07/15   [provider]  aspirin EC 81 MG tablet Take 81 mg by mouth daily.    [provider]  Cholecalciferol 25 MCG (1000 UT) capsule Take 2,000 Units by mouth daily.     [provider]  clopidogrel (PLAVIX) 75 MG tablet Take 75 mg by mouth daily. Reported on 02/28/2015    [provider]  furosemide (LASIX) 40 MG tablet Take 40 mg by mouth daily.     [provider]  gabapentin (NEURONTIN) 300 MG capsule Take 300 mg by mouth 2 (two) times daily.    [provider]  Insulin Aspart (NOVOLOG [place]) Inject 0-16 Units into the skin 3 (three) times daily as needed (Using according to sliding scale at home.). Sliding  scale    [provider]  INVOKANA 100 MG TABS tablet Take 100 mg by mouth daily. 05/24/15   [provider]  liraglutide (VICTOZA) 18 MG/3ML SOPN Inject 1.8 mg into the skin daily.    [provider]  metFORMIN (GLUCOPHAGE) 1000 MG tablet Take 1,000 mg by mouth 2 (two) times daily with a meal.     [provider]  nitroGLYCERIN (NITROSTAT) 0.4 MG SL tablet Place 0.4 mg under the tongue as needed. 12/25/13   [provider]  omeprazole (PRILOSEC) 40 MG capsule Take 40 mg by mouth daily.    [provider]  Oxycodone HCl 10 MG TABS Take 1 tablet (10 mg total) by mouth every 4 (four) hours as needed (Pain). Patient taking differently: Take 15 mg by mouth 5 (five) times daily.  06/08/15   Franky Macho, MD    Allergies    Patient has no known allergies.  Review of Systems   Review of Systems  Constitutional: Positive for appetite change and unexpected weight change. Negative for chills and fever.  HENT: Negative for congestion and rhinorrhea.   Respiratory: Negative for cough and shortness of breath.   Cardiovascular: Negative for chest pain, palpitations and leg swelling.  Gastrointestinal:  Positive for abdominal distention, abdominal pain, anorexia, nausea and vomiting. Negative for constipation, diarrhea and hematemesis.  Genitourinary: Negative for difficulty urinating, dysuria and hematuria.  Musculoskeletal: Negative for arthralgias and back pain.  Skin: Negative for color change and rash.  Neurological: Negative for light-headedness and headaches.  All other systems reviewed and are negative.   Physical Exam Updated Vital Signs BP (!) 162/77    Pulse 68    Temp 98.4 F (36.9 C)    Ht 4\' 11"  (1.499 m)    Wt 77.6 kg  SpO2 97%    BMI 34.54 kg/m   Physical Exam Vitals and nursing note reviewed. Exam conducted with a chaperone present.  Constitutional:      General: She is not in acute distress.    Appearance: She is well-developed.  HENT:     Head: Normocephalic and atraumatic.  Eyes:     Conjunctiva/sclera: Conjunctivae normal.  Cardiovascular:     Rate and Rhythm: Normal rate and regular rhythm.     Heart sounds: No murmur heard.   Pulmonary:     Effort: Pulmonary effort is normal. No respiratory distress.     Breath sounds: Normal breath sounds.  Abdominal:     General: Bowel sounds are normal.     Palpations: Abdomen is soft.     Tenderness: There is abdominal tenderness in the epigastric area and left upper quadrant. There is no guarding or rebound. Negative signs include Murphy's sign and McBurney's sign.     Comments: Large surgical scar noted in the right upper quadrant, no obvious hernia, obese abdomen, no tympany on percussion  Musculoskeletal:     Cervical back: Neck supple.  Skin:    General: Skin is warm and dry.  Neurological:     Mental Status: She is alert.     ED Results / Procedures / Treatments   Labs (all labs ordered are listed, but only abnormal results are displayed) Labs Reviewed  COMPREHENSIVE METABOLIC PANEL - Abnormal; Notable for the following components:      Result Value   Chloride 95 (*)    Glucose, Bld 210 (*)     AST 67 (*)    Anion gap 16 (*)    All other components within normal limits  URINALYSIS, ROUTINE W REFLEX MICROSCOPIC - Abnormal; Notable for the following components:   Color, Urine STRAW (*)    Hgb urine dipstick SMALL (*)    All other components within normal limits  CBC  LIPASE, BLOOD  TROPONIN I (HIGH SENSITIVITY)    EKG None  Radiology CT ABDOMEN PELVIS W CONTRAST  Result Date: 09/09/2019 CLINICAL DATA:  Left-sided abdominal pain increased over the past few days EXAM: CT ABDOMEN AND PELVIS WITH CONTRAST TECHNIQUE: Multidetector CT imaging of the abdomen and pelvis was performed using the standard protocol following bolus administration of intravenous contrast. CONTRAST:  OMNIPAQUE IOHEXOL 300 MG/ML  SOLN COMPARISON:  06/02/2018 FINDINGS: Lower chest: No acute abnormality. Hepatobiliary: Fatty infiltration of the liver is again noted. Gallbladder has been surgically removed. Pancreas: Unremarkable. No pancreatic ductal dilatation or surrounding inflammatory changes. Spleen: Normal in size without focal abnormality. Adrenals/Urinary Tract: Adrenal glands are within normal limits. Kidneys demonstrate a normal enhancement pattern. Nonobstructing lower pole left renal stone is again seen and stable. Normal excretion of contrast is noted. No obstructive changes are seen. Bladder is well distended. Stomach/Bowel: Appendix is within normal limits. No obstructive or inflammatory changes of the colon or small bowel are seen. Stomach is within normal limits. Vascular/Lymphatic: Aortic atherosclerosis. No enlarged abdominal or pelvic lymph nodes. Reproductive: Uterus and bilateral adnexa are unremarkable. Other: No abdominal wall hernia or abnormality. No abdominopelvic ascites. Musculoskeletal: No acute or significant osseous findings. IMPRESSION: Stable nonobstructing left renal stone. No new focal abnormality is seen. Electronically Signed   By: Alcide Clever M.D.   On: 09/09/2019 13:25     Procedures Procedures (including critical care time)  Medications Ordered in ED Medications  iohexol (OMNIPAQUE) 300 MG/ML solution 100 mL (100 mLs Intravenous Contrast Given 09/09/19 1255)  ED Course  I have reviewed the triage vital signs and the nursing notes.  Pertinent labs & imaging results that were available during my care of the patient were reviewed by me and considered in my medical decision making (see chart for details).    MDM Rules/Calculators/A&P                         54 year old female comes in with acute on chronic worsening abdominal pain described as epigastric left upper quadrant and left flank.  Source every time she eats, nothing makes it better.  She has had work-up in the past including x-ray that showed a kidney stone otherwise no work-up.  She is on antacids, she feels that she is maximized outpatient therapy.  Upon arrival she is hemodynamically stable and afebrile.  She is well-appearing with a nonperitoneal neck abdomen.  However due to the chronicity of her symptoms and the distention she reports we did get a CT scan to evaluate for any significant abnormality.  With my low suspicion a negative CT scan would be reassuring.  Laboratory analysis of urine kidney liver pancreatic function and electrolytes was sent as well as a CBC.  Laboratory analysis reviewed by myself shows no significant derangements.  CT scan reviewed by myself and radiology shows no significant acute abnormalities, she has the known left renal stone, I do not feel that is likely to be causing her symptoms.  Do not feel that there is any surgical pathology that needs emergent intervention at this time.  I recommend that she follows up with an outpatient provider, specifically gastroenterologist and she states that her primary doctor can refer her to this.  She feels safe for discharge home, no additional work-up or intervention needed at this time.  Reviewed the plan and all concerns with  the family, the patient and her husband have no further questions or concerns and feels safe for discharge home.  Final Clinical Impression(s) / ED Diagnoses Final diagnoses:  Undifferentiated abdominal pain    Rx / DC Orders ED Discharge Orders    None       Sabino Donovan, MD 09/09/19 1418

## 2019-09-13 NOTE — Telephone Encounter (Signed)
Noted  

## 2019-09-14 ENCOUNTER — Other Ambulatory Visit: Payer: Self-pay

## 2019-09-14 ENCOUNTER — Encounter: Payer: Self-pay | Admitting: *Deleted

## 2019-09-14 ENCOUNTER — Encounter: Payer: Self-pay | Admitting: Nurse Practitioner

## 2019-09-14 ENCOUNTER — Ambulatory Visit: Payer: BC Managed Care – PPO | Admitting: Nurse Practitioner

## 2019-09-14 ENCOUNTER — Telehealth: Payer: Self-pay | Admitting: *Deleted

## 2019-09-14 VITALS — BP 126/73 | HR 73 | Temp 97.0°F | Ht 59.0 in | Wt 173.4 lb

## 2019-09-14 DIAGNOSIS — K219 Gastro-esophageal reflux disease without esophagitis: Secondary | ICD-10-CM | POA: Insufficient documentation

## 2019-09-14 DIAGNOSIS — R1310 Dysphagia, unspecified: Secondary | ICD-10-CM | POA: Insufficient documentation

## 2019-09-14 DIAGNOSIS — K5900 Constipation, unspecified: Secondary | ICD-10-CM | POA: Diagnosis not present

## 2019-09-14 DIAGNOSIS — R109 Unspecified abdominal pain: Secondary | ICD-10-CM

## 2019-09-14 MED ORDER — SUCRALFATE 1 GM/10ML PO SUSP: 1.0000 g | Freq: Four times a day (QID) | ORAL | 2 refills | Status: DC | PRN

## 2019-09-14 NOTE — Patient Instructions (Addendum)
Your health issues we discussed today were:   GERD (reflux/heartburn) with abdominal pain: 1. As we discussed, increase your Prilosec to 40 mg twice a day.  Take this first thing in the morning on an empty stomach and 20 to 30 minutes before your last meal the day 2. Call us in 2 to 3 weeks and let us know if you are doing any better.  If not, we will change your medication 3. I have sent in a prescription for Carafate 1 g to your pharmacy.  Take this 4 times a day, as needed for breakthrough/significant heartburn symptoms 4. Call us if any problems obtaining Carafate  Dysphagia (swallowing difficulties): 1. We will schedule an upper endoscopy to evaluate the cause of your symptoms as well as to potentially dilate to help with your swallowing difficulties 2. Further recommendations will follow your procedure 3. In the meantime, ensure you are chewing adequately, keeping fluids on hand while eating, taking pills with a full glass of water, eating softer foods that are easier to pass 4. Call us for any worsening or severe symptoms 5. If food gets stuck and will not go forward and will not go backward for 2 or more hours, proceed to the emergency room  Constipation: 1. I am glad you are doing better! 2. Continue your current medications and let us know if you have any worsening or severe symptoms  Overall I recommend:  1. Continue your other current medications 2. Return for follow-up in 2 months 3. Call us if you have any questions or concerns   ---------------------------------------------------------------  I am glad you have gotten your COVID-19 vaccination!  Even though you are fully vaccinated you should continue to follow CDC and state/local guidelines.  ---------------------------------------------------------------   At [facility] we value your feedback. You may receive a survey about your visit today. Please share your experience as we strive to create trusting  relationships with our patients to provide genuine, compassionate, quality care.  We appreciate your understanding and patience as we review any laboratory studies, imaging, and other diagnostic tests that are ordered as we care for you. Our office policy is 5 business days for review of these results, and any emergent or urgent results are addressed in a timely manner for your best interest. If you do not hear from our office in 1 week, please contact us.   We also encourage the use of MyChart, which contains your medical information for your review as well. If you are not enrolled in this feature, an access code is on this after visit summary for your convenience. Thank you for allowing [place] to be involved in your care.  It was great to see you today!  I hope you have a great Summer!!

## 2019-09-14 NOTE — Assessment & Plan Note (Addendum)
In addition to her exacerbated, progressively worsened GERD/gastritis/esophagitis symptoms as per above.  Comes she has started having esophageal solid food dysphagia and pill dysphagia.  Given her exacerbated symptoms and new development of dysphagia we will plan for an EGD to further evaluate.  We will plan for possible dilation as well.  Further GERD management as per above.  Follow-up in 2 months.  Proceed with EGD with Dr. Gala Romney in near future: the risks, benefits, and alternatives have been discussed with the patient in detail. The patient states understanding and desires to proceed.  Patient is currently on Neurontin, oxycodone, Plavix, insulin, Metformin. The patient is not on any other anticoagulants, anxiolytics, chronic pain medications, antidepressants, antidiabetics, or iron supplements.  Denies alcohol and drug use.  We will plan for the procedure on propofol/MAC to promote adequate sedation.  No need to hold Plavix at this time.  ASA III (history of MI greater than 3 months ago; hypertension well controlled)

## 2019-09-14 NOTE — Assessment & Plan Note (Signed)
Today the patient describes progressively worsening left upper quadrant burning pain, esophageal burning, radiation of pain around to her side over the past 6 months.  In the past several weeks it has gotten even progressively worse.  Required ER visit.  She does have a history of chronic GERD and has been on Prilosec 40 mg daily for some time.  Query possible H. pylori gastritis, esophagitis, peptic ulcer disease.  She denies NSAIDs.  Less likely gastric cancer.  At this point, given her progressive and severe symptoms despite PPI we will plan for an upper endoscopy as per above.  Further recommendations will follow.

## 2019-09-14 NOTE — Telephone Encounter (Signed)
Called pt. egd +/- dil scheduled for 8/2 at 11:00am. Pt aware will need pre-op/covid test appt. Will mail this with her instructions. Confirmed address.

## 2019-09-14 NOTE — Progress Notes (Signed)
Primary Care Physician:  Rebekah Chesterfield, NP Primary Gastroenterologist:  Dr. Jena Gauss  Chief Complaint  Patient presents with  . Abdominal Pain    LUQ to left side. Went to ED 09/09/19 for the pain. Burning sensation in stomach  . Nausea    all the time, has oil based taste in mouth  . Gas    a lot but bowels move fine    HPI:   [name] is a 54 y.o. female who presents for abdominal swelling.  The patient has not been seen by our service since 2017.  Noted history of bloating, constipation.  Colonoscopy dated 2016, recent EUS due to gastric nodule which remains unchanged.  GES February 2017 with accelerated gastric emptying.  CTA negative for mesenteric ischemia but noted upper abdominal ventral hernia status post incisional hernia repair with mesh.  At her last visit 01/30/2016 having epigastric pain that wraps around her back, frequent choking, Prilosec once daily.  Has been seen by [facility] for chronic abdominal pain at the site of the hernia felt may be a deep suture causing her symptoms.  Recommended Carafate, continue Prilosec, call in 1 week if no improvement, follow-up in 6 weeks.  She was a no-show to her follow-up visit.  She was seen in the emergency department 09/09/2019 for abdominal pain.  Her pain was described as epigastric, left upper quadrant, left leg that was moderate, gradual onset, intermittent and chronic.  Has tried over-the-counter antacids, NSAIDs, acetaminophen without improvement.  Associated with nausea and vomiting.  Labs are essentially normal.  CT of the abdomen and pelvis was essentially unremarkable, chronic known left renal stone likely not contributing to her symptoms.  She was discharged home in satisfactory condition.  Today she states she's doing ok overall. Is having persistent LUQ abdominal burning pain associated with nausea. Also with a lot of gas. Abdominal pain and nausea worsened over the past 6 months,  progressively worse recently. Pain worse if she eats, but also hurts if she doesn't eat. Has lost about 20 lbs, but has gained it back. Has worsening stomach pain when she goes to the gym. Still on PPI. Has bowel movement daily, soft and no straining. Has vomited, last episode 2 days ago; looks oil based. No hematemesis. Has noted black stools intermittently, last noted about a week ago. Also with solid food dysphagia and pill dysphagia. Denies hematochezia,  fever, chills. Denies URI or flu-like symptoms. Denies loss of sense of taste or smell. The patient has received COVID-19 vaccination(s). Denies chest pain, dyspnea, dizziness, lightheadedness, syncope, near syncope. Denies any other upper or lower GI symptoms.  On Prilosec daily; no other PPIs prior. Saw cardiology and had workup to ensure not cardiac and her workup was normal. Denies NSAIDs. ASA powders; only uses Tylenol  Past Medical History:  Diagnosis Date  . Diabetes mellitus   . GERD (gastroesophageal reflux disease)   . H/O hiatal hernia   . High cholesterol   . History of kidney stones   . Hypertension   . Hypothyroidism   . MI (myocardial infarction) (HCC) 2015  . Neuromuscular disorder (HCC)    neuropathy; carpal tunnel BOTH WRISTS    Past Surgical History:  Procedure Laterality Date  . CESAREAN SECTION     X 3  . CHOLECYSTECTOMY    . COLONOSCOPY  April 2016   Dr. Teena Dunk: diverticulum in ascending colon, small internal hemorrhoids,   . COLONOSCOPY  2013  Dr. Randa Evens: normal mucosa, internal hemorrhoids. path: benign colonic mucosa  . COLONOSCOPY WITH ESOPHAGOGASTRODUODENOSCOPY (EGD)     Dr. Randa Evens.   . CORONARY ANGIOPLASTY WITH STENT PLACEMENT  2015   STENT X 1  . ESOPHAGOGASTRODUODENOSCOPY N/A 04/11/2015   Dr. Outlaw:Gastric nodule   . ESOPHAGOGASTRODUODENOSCOPY  2013   Dr. Randa Evens: normal esophagus. medium-sized submucosal mass with no bleeding and no stigmata of recent bleeding, s/p biopsy. path: benign small  bowel mucosa, surgical polyp with polypoid reactive-type gastropathy, negative h.pylori   . EUS  11/18/2011   Dr. Dulce Sellar: gastric nodule, suspect benign leiomyoma or focal thicekning of muscularis propria with extension to the pyloric channel. Consider repeat EUS in 1-2 years.   . EUS N/A 04/11/2015   Dr. Dulce Sellar: 10X52mm nodule, no significant change in size since last EUS Sept 2013. no further surveillance needed   . INCISIONAL HERNIA REPAIR N/A 06/08/2015   Procedure: Sherald Hess HERNIORRHAPHY WITH MESH;  Surgeon: Franky Macho, MD;  Location: [facility];  Service: General;  Laterality: N/A;  . INSERTION OF MESH N/A 06/08/2015   Procedure: INSERTION OF MESH;  Surgeon: Franky Macho, MD;  Location: [facility];  Service: General;  Laterality: N/A;  . UTERINE ABLATION      Current Outpatient Medications  Medication Sig Dispense Refill  . aspirin EC 81 MG tablet Take 81 mg by mouth daily.    . bumetanide (BUMEX) 1 MG tablet Take 1 mg by mouth daily.    . Cholecalciferol 25 MCG (1000 UT) capsule Take 2,000 Units by mouth daily.     . clopidogrel (PLAVIX) 75 MG tablet Take 75 mg by mouth daily. Reported on 02/28/2015    . estradiol (ESTRACE) 1 MG tablet Take 1 mg by mouth daily.    Marland Kitchen gabapentin (NEURONTIN) 300 MG capsule Take 300 mg by mouth 2 (two) times daily.    Marland Kitchen icosapent Ethyl (VASCEPA) 1 g capsule Take 2 g by mouth 2 (two) times daily.    . Insulin Glargine-Lixisenatide (SOLIQUA) 100-33 UNT-MCG/ML SOPN Inject 30 Units into the skin in the morning.    Marland Kitchen levothyroxine (SYNTHROID) 75 MCG tablet Take 75 mcg by mouth daily before breakfast.    . linaclotide (LINZESS) 290 MCG CAPS capsule Take 290 mcg by mouth daily before breakfast.    . losartan-hydrochlorothiazide (HYZAAR) 50-12.5 MG tablet Take 1 tablet by mouth daily.    . metFORMIN (GLUCOPHAGE) 1000 MG tablet Take 1,000 mg by mouth 2 (two) times daily with a meal.     . nitroGLYCERIN (NITROSTAT) 0.4 MG SL tablet Place 0.4 mg under the tongue as needed.     Marland Kitchen omeprazole (PRILOSEC) 40 MG capsule Take 40 mg by mouth daily.    . Oxycodone HCl 10 MG TABS Take 1 tablet (10 mg total) by mouth every 4 (four) hours as needed (Pain). (Patient taking differently: Take 15 mg by mouth in the morning, at noon, in the evening, and at bedtime. ) 40 tablet 0  . spironolactone (ALDACTONE) 25 MG tablet Take 25 mg by mouth daily.    Marland Kitchen umeclidinium-vilanterol (ANORO ELLIPTA) 62.5-25 MCG/INH AEPB Inhale 1 puff into the lungs daily.     No current facility-administered medications for this visit.    Allergies as of 09/14/2019  . (No Known Allergies)    Family History  Problem Relation Age of Onset  . Colon cancer Neg Hx     Social History   Socioeconomic History  . Marital status: Married    Spouse name: Not on  file  . Number of children: Not on file  . Years of education: Not on file  . Highest education level: Not on file  Occupational History  . Not on file  Tobacco Use  . Smoking status: Current Every Day Smoker    Packs/day: 0.50    Types: Cigarettes  . Smokeless tobacco: Never Used  Vaping Use  . Vaping Use: Never used  Substance and Sexual Activity  . Alcohol use: No    Alcohol/week: 0.0 standard drinks  . Drug use: No  . Sexual activity: Not on file  Other Topics Concern  . Not on file  Social History Narrative  . Not on file   Social Determinants of Health   Financial Resource Strain:   . Difficulty of Paying Living Expenses:   Food Insecurity:   . Worried About Programme researcher, broadcasting/film/video in the Last Year:   . Barista in the Last Year:   Transportation Needs:   . Freight forwarder (Medical):   Marland Kitchen Lack of Transportation (Non-Medical):   Physical Activity:   . Days of Exercise per Week:   . Minutes of Exercise per Session:   Stress:   . Feeling of Stress :   Social Connections:   . Frequency of Communication with Friends and Family:   . Frequency of Social Gatherings with Friends and Family:   . Attends Religious  Services:   . Active Member of Clubs or Organizations:   . Attends Banker Meetings:   Marland Kitchen Marital Status:   Intimate Partner Violence:   . Fear of Current or Ex-Partner:   . Emotionally Abused:   Marland Kitchen Physically Abused:   . Sexually Abused:     Subjective: Review of Systems  Constitutional: Negative for chills, fever, malaise/fatigue and weight loss.  HENT: Negative for congestion and sore throat.   Respiratory: Negative for cough and shortness of breath.   Cardiovascular: Negative for chest pain and palpitations.  Gastrointestinal: Positive for abdominal pain, melena, nausea and vomiting. Negative for blood in stool and diarrhea.  Musculoskeletal: Negative for joint pain and myalgias.  Skin: Negative for rash.  Neurological: Negative for dizziness and weakness.  Endo/Heme/Allergies: Does not bruise/bleed easily.  Psychiatric/Behavioral: Negative for depression. The patient is not nervous/anxious.   All other systems reviewed and are negative.      Objective: BP 126/73   Pulse 73   Temp (!) 97 F (36.1 C) (Oral)   Ht 4\' 11"  (1.499 m)   Wt 173 lb 6.4 oz (78.7 kg)   BMI 35.02 kg/m  Physical Exam Vitals and nursing note reviewed.  Constitutional:      General: She is not in acute distress.    Appearance: Normal appearance. She is well-developed. She is obese. She is not ill-appearing, toxic-appearing or diaphoretic.  HENT:     Head: Normocephalic and atraumatic.     Nose: No congestion or rhinorrhea.  Eyes:     General: No scleral icterus. Cardiovascular:     Rate and Rhythm: Normal rate and regular rhythm.     Heart sounds: Normal heart sounds.  Pulmonary:     Effort: Pulmonary effort is normal. No respiratory distress.     Breath sounds: Normal breath sounds.  Abdominal:     General: Bowel sounds are normal.     Palpations: Abdomen is soft. There is no hepatomegaly, splenomegaly or mass.     Tenderness: There is abdominal tenderness in the epigastric  area and left upper  quadrant. There is no guarding or rebound.     Hernia: No hernia is present.  Skin:    General: Skin is warm and dry.     Coloration: Skin is not jaundiced.     Findings: No rash.  Neurological:     General: No focal deficit present.     Mental Status: She is alert and oriented to person, place, and time.  Psychiatric:        Attention and Perception: Attention normal.        Mood and Affect: Mood normal.        Speech: Speech normal.        Behavior: Behavior normal.        Thought Content: Thought content normal.        Cognition and Memory: Cognition and memory normal.       09/14/2019 2:51 PM   Disclaimer: This note was dictated with voice recognition software. Similar sounding words can inadvertently be transcribed and may not be corrected upon review.

## 2019-09-14 NOTE — Addendum Note (Signed)
Addended by: Delane Ginger, Elsy Chiang A on: 09/14/2019 03:01 PM   Modules accepted: Orders

## 2019-09-14 NOTE — Assessment & Plan Note (Signed)
Overall constipation doing well.  Recommend she continue her current medications and call us for any worsening or severe symptoms.  Otherwise follow-up in 2 months.

## 2019-09-14 NOTE — Assessment & Plan Note (Addendum)
Significant and progressively worse GERD/esophagitis/gastritis type symptoms.  The patient took it upon herself to see cardiology for a work-up just in case and they stated no cardiac etiology.  Symptoms are getting quite severe over the past 6 months, especially in the past couple few weeks.  She required emergency room evaluation.  Further details in HPI.  She has been on Prilosec 40 mg daily for some time.  At this point I will increase her Prilosec to twice daily and request proximal port in 2 to 3 weeks and if no improvement will change her to another agent such as Protonix or Nexium.  Continue other current medications.  Call us for any worsening or severe symptoms.  We will proceed with an EGD, as per below.  I will also send in Carafate 1 g 4 times a day as needed.  Noted suspicion for possible H. pylori and would likely benefit from gastric biopsies to evaluate.

## 2019-09-14 NOTE — H&P (View-Only) (Signed)
Primary Care Physician:  Rebekah Chesterfield, NP Primary Gastroenterologist:  Dr. Jena Gauss  Chief Complaint  Patient presents with  . Abdominal Pain    LUQ to left side. Went to ED 09/09/19 for the pain. Burning sensation in stomach  . Nausea    all the time, has oil based taste in mouth  . Gas    a lot but bowels move fine    HPI:   [name] is a 54 y.o. female who presents for abdominal swelling.  The patient has not been seen by our service since 2017.  Noted history of bloating, constipation.  Colonoscopy dated 2016, recent EUS due to gastric nodule which remains unchanged.  GES February 2017 with accelerated gastric emptying.  CTA negative for mesenteric ischemia but noted upper abdominal ventral hernia status post incisional hernia repair with mesh.  At her last visit 01/30/2016 having epigastric pain that wraps around her back, frequent choking, Prilosec once daily.  Has been seen by [facility] for chronic abdominal pain at the site of the hernia felt may be a deep suture causing her symptoms.  Recommended Carafate, continue Prilosec, call in 1 week if no improvement, follow-up in 6 weeks.  She was a no-show to her follow-up visit.  She was seen in the emergency department 09/09/2019 for abdominal pain.  Her pain was described as epigastric, left upper quadrant, left leg that was moderate, gradual onset, intermittent and chronic.  Has tried over-the-counter antacids, NSAIDs, acetaminophen without improvement.  Associated with nausea and vomiting.  Labs are essentially normal.  CT of the abdomen and pelvis was essentially unremarkable, chronic known left renal stone likely not contributing to her symptoms.  She was discharged home in satisfactory condition.  Today she states she's doing ok overall. Is having persistent LUQ abdominal burning pain associated with nausea. Also with a lot of gas. Abdominal pain and nausea worsened over the past 6 months,  progressively worse recently. Pain worse if she eats, but also hurts if she doesn't eat. Has lost about 20 lbs, but has gained it back. Has worsening stomach pain when she goes to the gym. Still on PPI. Has bowel movement daily, soft and no straining. Has vomited, last episode 2 days ago; looks oil based. No hematemesis. Has noted black stools intermittently, last noted about a week ago. Also with solid food dysphagia and pill dysphagia. Denies hematochezia,  fever, chills. Denies URI or flu-like symptoms. Denies loss of sense of taste or smell. The patient has received COVID-19 vaccination(s). Denies chest pain, dyspnea, dizziness, lightheadedness, syncope, near syncope. Denies any other upper or lower GI symptoms.  On Prilosec daily; no other PPIs prior. Saw cardiology and had workup to ensure not cardiac and her workup was normal. Denies NSAIDs. ASA powders; only uses Tylenol  Past Medical History:  Diagnosis Date  . Diabetes mellitus   . GERD (gastroesophageal reflux disease)   . H/O hiatal hernia   . High cholesterol   . History of kidney stones   . Hypertension   . Hypothyroidism   . MI (myocardial infarction) (HCC) 2015  . Neuromuscular disorder (HCC)    neuropathy; carpal tunnel BOTH WRISTS    Past Surgical History:  Procedure Laterality Date  . CESAREAN SECTION     X 3  . CHOLECYSTECTOMY    . COLONOSCOPY  April 2016   Dr. Teena Dunk: diverticulum in ascending colon, small internal hemorrhoids,   . COLONOSCOPY  2013  Dr. Randa Evens: normal mucosa, internal hemorrhoids. path: benign colonic mucosa  . COLONOSCOPY WITH ESOPHAGOGASTRODUODENOSCOPY (EGD)     Dr. Randa Evens.   . CORONARY ANGIOPLASTY WITH STENT PLACEMENT  2015   STENT X 1  . ESOPHAGOGASTRODUODENOSCOPY N/A 04/11/2015   Dr. Outlaw:Gastric nodule   . ESOPHAGOGASTRODUODENOSCOPY  2013   Dr. Randa Evens: normal esophagus. medium-sized submucosal mass with no bleeding and no stigmata of recent bleeding, s/p biopsy. path: benign small  bowel mucosa, surgical polyp with polypoid reactive-type gastropathy, negative h.pylori   . EUS  11/18/2011   Dr. Dulce Sellar: gastric nodule, suspect benign leiomyoma or focal thicekning of muscularis propria with extension to the pyloric channel. Consider repeat EUS in 1-2 years.   . EUS N/A 04/11/2015   Dr. Dulce Sellar: 10X52mm nodule, no significant change in size since last EUS Sept 2013. no further surveillance needed   . INCISIONAL HERNIA REPAIR N/A 06/08/2015   Procedure: Sherald Hess HERNIORRHAPHY WITH MESH;  Surgeon: Franky Macho, MD;  Location: [facility];  Service: General;  Laterality: N/A;  . INSERTION OF MESH N/A 06/08/2015   Procedure: INSERTION OF MESH;  Surgeon: Franky Macho, MD;  Location: [facility];  Service: General;  Laterality: N/A;  . UTERINE ABLATION      Current Outpatient Medications  Medication Sig Dispense Refill  . aspirin EC 81 MG tablet Take 81 mg by mouth daily.    . bumetanide (BUMEX) 1 MG tablet Take 1 mg by mouth daily.    . Cholecalciferol 25 MCG (1000 UT) capsule Take 2,000 Units by mouth daily.     . clopidogrel (PLAVIX) 75 MG tablet Take 75 mg by mouth daily. Reported on 02/28/2015    . estradiol (ESTRACE) 1 MG tablet Take 1 mg by mouth daily.    Marland Kitchen gabapentin (NEURONTIN) 300 MG capsule Take 300 mg by mouth 2 (two) times daily.    Marland Kitchen icosapent Ethyl (VASCEPA) 1 g capsule Take 2 g by mouth 2 (two) times daily.    . Insulin Glargine-Lixisenatide (SOLIQUA) 100-33 UNT-MCG/ML SOPN Inject 30 Units into the skin in the morning.    Marland Kitchen levothyroxine (SYNTHROID) 75 MCG tablet Take 75 mcg by mouth daily before breakfast.    . linaclotide (LINZESS) 290 MCG CAPS capsule Take 290 mcg by mouth daily before breakfast.    . losartan-hydrochlorothiazide (HYZAAR) 50-12.5 MG tablet Take 1 tablet by mouth daily.    . metFORMIN (GLUCOPHAGE) 1000 MG tablet Take 1,000 mg by mouth 2 (two) times daily with a meal.     . nitroGLYCERIN (NITROSTAT) 0.4 MG SL tablet Place 0.4 mg under the tongue as needed.     Marland Kitchen omeprazole (PRILOSEC) 40 MG capsule Take 40 mg by mouth daily.    . Oxycodone HCl 10 MG TABS Take 1 tablet (10 mg total) by mouth every 4 (four) hours as needed (Pain). (Patient taking differently: Take 15 mg by mouth in the morning, at noon, in the evening, and at bedtime. ) 40 tablet 0  . spironolactone (ALDACTONE) 25 MG tablet Take 25 mg by mouth daily.    Marland Kitchen umeclidinium-vilanterol (ANORO ELLIPTA) 62.5-25 MCG/INH AEPB Inhale 1 puff into the lungs daily.     No current facility-administered medications for this visit.    Allergies as of 09/14/2019  . (No Known Allergies)    Family History  Problem Relation Age of Onset  . Colon cancer Neg Hx     Social History   Socioeconomic History  . Marital status: Married    Spouse name: Not on  file  . Number of children: Not on file  . Years of education: Not on file  . Highest education level: Not on file  Occupational History  . Not on file  Tobacco Use  . Smoking status: Current Every Day Smoker    Packs/day: 0.50    Types: Cigarettes  . Smokeless tobacco: Never Used  Vaping Use  . Vaping Use: Never used  Substance and Sexual Activity  . Alcohol use: No    Alcohol/week: 0.0 standard drinks  . Drug use: No  . Sexual activity: Not on file  Other Topics Concern  . Not on file  Social History Narrative  . Not on file   Social Determinants of Health   Financial Resource Strain:   . Difficulty of Paying Living Expenses:   Food Insecurity:   . Worried About Programme researcher, broadcasting/film/video in the Last Year:   . Barista in the Last Year:   Transportation Needs:   . Freight forwarder (Medical):   Marland Kitchen Lack of Transportation (Non-Medical):   Physical Activity:   . Days of Exercise per Week:   . Minutes of Exercise per Session:   Stress:   . Feeling of Stress :   Social Connections:   . Frequency of Communication with Friends and Family:   . Frequency of Social Gatherings with Friends and Family:   . Attends Religious  Services:   . Active Member of Clubs or Organizations:   . Attends Banker Meetings:   Marland Kitchen Marital Status:   Intimate Partner Violence:   . Fear of Current or Ex-Partner:   . Emotionally Abused:   Marland Kitchen Physically Abused:   . Sexually Abused:     Subjective: Review of Systems  Constitutional: Negative for chills, fever, malaise/fatigue and weight loss.  HENT: Negative for congestion and sore throat.   Respiratory: Negative for cough and shortness of breath.   Cardiovascular: Negative for chest pain and palpitations.  Gastrointestinal: Positive for abdominal pain, melena, nausea and vomiting. Negative for blood in stool and diarrhea.  Musculoskeletal: Negative for joint pain and myalgias.  Skin: Negative for rash.  Neurological: Negative for dizziness and weakness.  Endo/Heme/Allergies: Does not bruise/bleed easily.  Psychiatric/Behavioral: Negative for depression. The patient is not nervous/anxious.   All other systems reviewed and are negative.      Objective: BP 126/73   Pulse 73   Temp (!) 97 F (36.1 C) (Oral)   Ht 4\' 11"  (1.499 m)   Wt 173 lb 6.4 oz (78.7 kg)   BMI 35.02 kg/m  Physical Exam Vitals and nursing note reviewed.  Constitutional:      General: She is not in acute distress.    Appearance: Normal appearance. She is well-developed. She is obese. She is not ill-appearing, toxic-appearing or diaphoretic.  HENT:     Head: Normocephalic and atraumatic.     Nose: No congestion or rhinorrhea.  Eyes:     General: No scleral icterus. Cardiovascular:     Rate and Rhythm: Normal rate and regular rhythm.     Heart sounds: Normal heart sounds.  Pulmonary:     Effort: Pulmonary effort is normal. No respiratory distress.     Breath sounds: Normal breath sounds.  Abdominal:     General: Bowel sounds are normal.     Palpations: Abdomen is soft. There is no hepatomegaly, splenomegaly or mass.     Tenderness: There is abdominal tenderness in the epigastric  area and left upper  quadrant. There is no guarding or rebound.     Hernia: No hernia is present.  Skin:    General: Skin is warm and dry.     Coloration: Skin is not jaundiced.     Findings: No rash.  Neurological:     General: No focal deficit present.     Mental Status: She is alert and oriented to person, place, and time.  Psychiatric:        Attention and Perception: Attention normal.        Mood and Affect: Mood normal.        Speech: Speech normal.        Behavior: Behavior normal.        Thought Content: Thought content normal.        Cognition and Memory: Cognition and memory normal.       09/14/2019 2:51 PM   Disclaimer: This note was dictated with voice recognition software. Similar sounding words can inadvertently be transcribed and may not be corrected upon review.  

## 2019-09-26 ENCOUNTER — Telehealth: Payer: Self-pay | Admitting: Internal Medicine

## 2019-09-26 ENCOUNTER — Telehealth: Payer: Self-pay | Admitting: Emergency Medicine

## 2019-09-26 NOTE — Telephone Encounter (Signed)
Called pt verified name and dob pt stated omeprazole 40mg  bid and sucralfate 10mg  is not helping and she wants to know if it can be changed. pts pcp placed her on  Dicyclomine 20mg  three times a day and it is not helping. Pt states she is still hurting in her left flank area. Procedure scheduled for 10/10/19

## 2019-09-26 NOTE — Telephone Encounter (Signed)
[phone] PATIENT CALLED AND SAID THAT THE MEDICATION ERIC GAVE HER TO TRY IS NOT WORKING AND HER ISSUES ARE STILL ONGOING

## 2019-10-03 NOTE — Patient Instructions (Addendum)
20    Your procedure is scheduled on: 10/10/2019  Report to [facility] at   9:30  AM.  Call this number if you have problems the morning of surgery: [phone]   Remember:   Follow instructions on letter from office regarding when to stop eating and drinking        No Smoking the day of procedure      Take these medicines the morning of surgery with A SIP OF WATER: Farxiga, Lexapro, Omeprazole and Gabapentin.  Please use inhaler before coming to the hospital.   No diabetic medications the am of surgery.   Do not wear jewelry, make-up or nail polish.  Do not wear lotions, powders, or perfumes. You may wear deodorant.                Do not bring valuables to the hospital.  Contacts, dentures or bridgework may not be worn into surgery.  Leave suitcase in the car. After surgery it may be brought to your room.  For patients admitted to the hospital, checkout time is 11:00 AM the day of discharge.   Patients discharged the day of surgery will not be allowed to drive home. Upper Endoscopy, Adult Upper endoscopy is a procedure to look inside the upper GI (gastrointestinal) tract. The upper GI tract is made up of:  The part of the body that moves food from your mouth to your stomach (esophagus).  The stomach.  The first part of your small intestine (duodenum). This procedure is also called esophagogastroduodenoscopy (EGD) or gastroscopy. In this procedure, your health care provider passes a thin, flexible tube (endoscope) through your mouth and down your esophagus into your stomach. A small camera is attached to the end of the tube. Images from the camera appear on a monitor in the exam room. During this procedure, your health care provider may also remove a small piece of tissue to be sent to a lab and examined under a microscope (biopsy). Your health care provider may do an upper endoscopy to diagnose cancers of the upper GI tract. You may also have this procedure to find the cause of other  conditions, such as:  Stomach pain.  Heartburn.  Pain or problems when swallowing.  Nausea and vomiting.  Stomach bleeding.  Stomach ulcers. Tell a health care provider about:  Any allergies you have.  All medicines you are taking, including vitamins, herbs, eye drops, creams, and over-the-counter medicines.  Any problems you or family members have had with anesthetic medicines.  Any blood disorders you have.  Any surgeries you have had.  Any medical conditions you have.  Whether you are pregnant or may be pregnant. What are the risks? Generally, this is a safe procedure. However, problems may occur, including:  Infection.  Bleeding.  Allergic reactions to medicines.  A tear or hole (perforation) in the esophagus, stomach, or duodenum. What happens before the procedure? Staying hydrated Follow instructions from your health care provider about hydration, which may include:  Up to 2 hours before the procedure - you may continue to drink clear liquids, such as water, clear fruit juice, black coffee, and plain tea.  Eating and drinking restrictions Follow instructions from your health care provider about eating and drinking, which may include:  8 hours before the procedure - stop eating heavy meals or foods, such as meat, fried foods, or fatty foods.  6 hours before the procedure - stop eating light meals or foods, such as toast or cereal.  6 hours before the procedure - stop drinking milk or drinks that contain milk.  2 hours before the procedure - stop drinking clear liquids. Medicines Ask your health care provider about:  Changing or stopping your regular medicines. This is especially important if you are taking diabetes medicines or blood thinners.  Taking medicines such as aspirin and ibuprofen. These medicines can thin your blood. Do not take these medicines unless your health care provider tells you to take them.  Taking over-the-counter medicines,  vitamins, herbs, and supplements. General instructions  Plan to have someone take you home from the hospital or clinic.  If you will be going home right after the procedure, plan to have someone with you for 24 hours.  Ask your health care provider what steps will be taken to help prevent infection. What happens during the procedure?  1. An IV will be inserted into one of your veins. 2. You may be given one or more of the following: ? A medicine to help you relax (sedative). ? A medicine to numb the throat (local anesthetic). 3. You will lie on your left side on an exam table. 4. Your health care provider will pass the endoscope through your mouth and down your esophagus. 5. Your health care provider will use the scope to check the inside of your esophagus, stomach, and duodenum. Biopsies may be taken. 6. The endoscope will be removed. The procedure may vary among health care providers and hospitals. What happens after the procedure?  Your blood pressure, heart rate, breathing rate, and blood oxygen level will be monitored until you leave the hospital or clinic.  Do not drive for 24 hours if you were given a sedative during your procedure.  When your throat is no longer numb, you may be given some fluids to drink.  It is up to you to get the results of your procedure. Ask your health care provider, or the department that is doing the procedure, when your results will be ready. Summary  Upper endoscopy is a procedure to look inside the upper GI tract.  During the procedure, an IV will be inserted into one of your veins. You may be given a medicine to help you relax.  A medicine will be used to numb your throat.  The endoscope will be passed through your mouth and down your esophagus. This information is not intended to replace advice given to you by your health care provider. Make sure you discuss any questions you have with your health care provider. Document Revised: 08/19/2017  Document Reviewed: 07/27/2017 Elsevier Patient Education  2020 Elsevier Inc.                                                                                                                                      EndoscopyCare After  Please read the instructions outlined below and refer to this sheet in the next few weeks. These  discharge instructions provide you with general information on caring for yourself after you leave the hospital. Your doctor may also give you specific instructions. While your treatment has been planned according to the most current medical practices available, unavoidable complications occasionally occur. If you have any problems or questions after discharge, please call your doctor. HOME CARE INSTRUCTIONS Activity  You may resume your regular activity but move at a slower pace for the next 24 hours.   Take frequent rest periods for the next 24 hours.   Walking will help expel (get rid of) the air and reduce the bloated feeling in your abdomen.   No driving for 24 hours (because of the anesthesia (medicine) used during the test).   You may shower.   Do not sign any important legal documents or operate any machinery for 24 hours (because of the anesthesia used during the test).  Nutrition  Drink plenty of fluids.   You may resume your normal diet.   Begin with a light meal and progress to your normal diet.   Avoid alcoholic beverages for 24 hours or as instructed by your caregiver.  Medications You may resume your normal medications unless your caregiver tells you otherwise. What you can expect today  You may experience abdominal discomfort such as a feeling of fullness or "gas" pains.   You may experience a sore throat for 2 to 3 days. This is normal. Gargling with salt water may help this.  Follow-up Your doctor will discuss the results of your test with you. SEEK IMMEDIATE MEDICAL CARE IF:  You have excessive nausea (feeling sick to your stomach)  and/or vomiting.   You have severe abdominal pain and distention (swelling).   You have trouble swallowing.   You have a temperature over 100 F (37.8 C).   You have rectal bleeding or vomiting of blood.  Document Released: 10/09/2003 Document Revised: 02/13/2011 Document Reviewed: 04/21/2007

## 2019-10-05 ENCOUNTER — Telehealth: Payer: Self-pay

## 2019-10-05 NOTE — Telephone Encounter (Signed)
Received voicemail from [place] at pre-service center, pt needs PA for EGD but not for Dilation. [phone], ext [phone]).  Per [employer], prior authorizations are not taken over the phone. Printed auth form and faxed with clinical notes.  Tried to call Renee, no answer, LMOVM to inform her PA was faxed to [facility].

## 2019-10-06 NOTE — Telephone Encounter (Signed)
Received fax from [place], handwritten faxes are not accepted.   Fax form completed online and faxed with clinical notes.

## 2019-10-07 ENCOUNTER — Encounter (HOSPITAL_COMMUNITY)
Admission: RE | Admit: 2019-10-07 | Discharge: 2019-10-07 | Disposition: A | Discharge status: Home / Self Care | Payer: BC Managed Care – PPO | Source: Ambulatory Visit | Attending: Internal Medicine | Admitting: Internal Medicine

## 2019-10-07 ENCOUNTER — Other Ambulatory Visit: Payer: Self-pay

## 2019-10-07 ENCOUNTER — Encounter (HOSPITAL_COMMUNITY): Payer: Self-pay

## 2019-10-07 ENCOUNTER — Other Ambulatory Visit (HOSPITAL_COMMUNITY)
Admission: RE | Admit: 2019-10-07 | Discharge: 2019-10-07 | Disposition: A | Discharge status: Home / Self Care | Payer: BC Managed Care – PPO | Source: Ambulatory Visit | Attending: Internal Medicine | Admitting: Internal Medicine

## 2019-10-07 DIAGNOSIS — Z01812 Encounter for preprocedural laboratory examination: Secondary | ICD-10-CM | POA: Insufficient documentation

## 2019-10-07 DIAGNOSIS — Z20822 Contact with and (suspected) exposure to covid-19: Secondary | ICD-10-CM | POA: Insufficient documentation

## 2019-10-07 LAB — BASIC METABOLIC PANEL
Anion gap: 15 (ref 5–15)
BUN: 17 mg/dL (ref 6–20)
CO2: 24 mmol/L (ref 22–32)
Calcium: 8.9 mg/dL (ref 8.9–10.3)
Chloride: 99 mmol/L (ref 98–111)
Creatinine, Ser: 0.7 mg/dL (ref 0.44–1.00)
GFR calc Af Amer: >60 mL/min (ref >60)
GFR calc non Af Amer: >60 mL/min (ref >60)
Glucose, Bld: 178 mg/dL — ABNORMAL HIGH (ref 70–99)
Potassium: 3.5 mmol/L (ref 3.5–5.1)
Sodium: 138 mmol/L (ref 135–145)

## 2019-10-07 LAB — HCG, SERUM, QUALITATIVE: Preg, Serum: NEGATIVE

## 2019-10-07 NOTE — Telephone Encounter (Signed)
Realized date scheduled on fax yesterday was 10/30/19 (should be 10/10/19). Corrected form faxed with cover sheet and clinical notes with correct scheduled date.

## 2019-10-08 LAB — SARS CORONAVIRUS 2 (TAT 6-24 HRS): SARS Coronavirus 2: NEGATIVE

## 2019-10-10 ENCOUNTER — Ambulatory Visit (HOSPITAL_COMMUNITY): Payer: BC Managed Care – PPO | Admitting: Anesthesiology

## 2019-10-10 ENCOUNTER — Ambulatory Visit (HOSPITAL_COMMUNITY)
Admission: RE | Admit: 2019-10-10 | Discharge: 2019-10-10 | Disposition: A | Discharge status: Home / Self Care | Payer: BC Managed Care – PPO | Attending: Internal Medicine | Admitting: Internal Medicine

## 2019-10-10 ENCOUNTER — Encounter (HOSPITAL_COMMUNITY)
Admission: RE | Disposition: A | Discharge status: Home / Self Care | Payer: Self-pay | Source: Home / Self Care | Attending: Internal Medicine

## 2019-10-10 ENCOUNTER — Encounter (HOSPITAL_COMMUNITY): Payer: Self-pay | Admitting: Internal Medicine

## 2019-10-10 DIAGNOSIS — Z955 Presence of coronary angioplasty implant and graft: Secondary | ICD-10-CM | POA: Insufficient documentation

## 2019-10-10 DIAGNOSIS — Z7982 Long term (current) use of aspirin: Secondary | ICD-10-CM | POA: Insufficient documentation

## 2019-10-10 DIAGNOSIS — E039 Hypothyroidism, unspecified: Secondary | ICD-10-CM | POA: Diagnosis not present

## 2019-10-10 DIAGNOSIS — I252 Old myocardial infarction: Secondary | ICD-10-CM | POA: Insufficient documentation

## 2019-10-10 DIAGNOSIS — K259 Gastric ulcer, unspecified as acute or chronic, without hemorrhage or perforation: Secondary | ICD-10-CM | POA: Diagnosis not present

## 2019-10-10 DIAGNOSIS — K319 Disease of stomach and duodenum, unspecified: Secondary | ICD-10-CM | POA: Diagnosis not present

## 2019-10-10 DIAGNOSIS — F17210 Nicotine dependence, cigarettes, uncomplicated: Secondary | ICD-10-CM | POA: Diagnosis not present

## 2019-10-10 DIAGNOSIS — R1012 Left upper quadrant pain: Secondary | ICD-10-CM | POA: Insufficient documentation

## 2019-10-10 DIAGNOSIS — R1310 Dysphagia, unspecified: Secondary | ICD-10-CM | POA: Insufficient documentation

## 2019-10-10 DIAGNOSIS — E119 Type 2 diabetes mellitus without complications: Secondary | ICD-10-CM | POA: Diagnosis not present

## 2019-10-10 DIAGNOSIS — K228 Other specified diseases of esophagus: Secondary | ICD-10-CM | POA: Diagnosis not present

## 2019-10-10 DIAGNOSIS — Z7902 Long term (current) use of antithrombotics/antiplatelets: Secondary | ICD-10-CM | POA: Diagnosis not present

## 2019-10-10 DIAGNOSIS — R112 Nausea with vomiting, unspecified: Secondary | ICD-10-CM | POA: Diagnosis not present

## 2019-10-10 DIAGNOSIS — I2510 Atherosclerotic heart disease of native coronary artery without angina pectoris: Secondary | ICD-10-CM | POA: Diagnosis not present

## 2019-10-10 DIAGNOSIS — K219 Gastro-esophageal reflux disease without esophagitis: Secondary | ICD-10-CM | POA: Insufficient documentation

## 2019-10-10 DIAGNOSIS — Z794 Long term (current) use of insulin: Secondary | ICD-10-CM | POA: Diagnosis not present

## 2019-10-10 DIAGNOSIS — Z79899 Other long term (current) drug therapy: Secondary | ICD-10-CM | POA: Insufficient documentation

## 2019-10-10 DIAGNOSIS — I10 Essential (primary) hypertension: Secondary | ICD-10-CM | POA: Diagnosis not present

## 2019-10-10 DIAGNOSIS — Z79890 Hormone replacement therapy (postmenopausal): Secondary | ICD-10-CM | POA: Diagnosis not present

## 2019-10-10 DIAGNOSIS — R143 Flatulence: Secondary | ICD-10-CM | POA: Insufficient documentation

## 2019-10-10 HISTORY — PX: ESOPHAGOGASTRODUODENOSCOPY (EGD) WITH PROPOFOL: SHX5813

## 2019-10-10 HISTORY — PX: MALONEY DILATION: SHX5535

## 2019-10-10 HISTORY — PX: BIOPSY: SHX5522

## 2019-10-10 LAB — GLUCOSE, CAPILLARY
Glucose-Capillary: 162 mg/dL — ABNORMAL HIGH (ref 70–99)
Glucose-Capillary: 120 mg/dL — ABNORMAL HIGH (ref 70–99)

## 2019-10-10 SURGERY — ESOPHAGOGASTRODUODENOSCOPY (EGD) WITH PROPOFOL
Anesthesia: General

## 2019-10-10 MED ORDER — PHENYLEPHRINE 40 MCG/ML (10ML) SYRINGE FOR IV PUSH (FOR BLOOD PRESSURE SUPPORT)
PREFILLED_SYRINGE | INTRAVENOUS | Status: AC
  Filled 2019-10-10: qty 10

## 2019-10-10 MED ORDER — KETAMINE HCL 10 MG/ML IJ SOLN
INTRAMUSCULAR | Status: DC | PRN
  Administered 2019-10-10 (×2): 10 mg via INTRAVENOUS

## 2019-10-10 MED ORDER — LIDOCAINE VISCOUS HCL 2 % MT SOLN
15.0000 mL | Freq: Once | OROMUCOSAL | Status: AC
  Administered 2019-10-10: 15 mL via OROMUCOSAL

## 2019-10-10 MED ORDER — EPHEDRINE 5 MG/ML INJ
INTRAVENOUS | Status: AC
  Filled 2019-10-10: qty 30

## 2019-10-10 MED ORDER — KETAMINE HCL 50 MG/5ML IJ SOSY
PREFILLED_SYRINGE | INTRAMUSCULAR | Status: AC
  Filled 2019-10-10: qty 5

## 2019-10-10 MED ORDER — EPHEDRINE SULFATE 50 MG/ML IJ SOLN
INTRAMUSCULAR | Status: DC | PRN
Start: 2019-10-10 — End: 2019-10-10
  Administered 2019-10-10: 10 mg via INTRAVENOUS

## 2019-10-10 MED ORDER — STERILE WATER FOR IRRIGATION IR SOLN
Status: DC | PRN
  Administered 2019-10-10: 1.5 mL

## 2019-10-10 MED ORDER — LACTATED RINGERS IV SOLN: Freq: Once | INTRAVENOUS | Status: AC

## 2019-10-10 MED ORDER — PROPOFOL 10 MG/ML IV BOLUS
INTRAVENOUS | Status: DC | PRN
  Administered 2019-10-10 (×3): 20 mg via INTRAVENOUS

## 2019-10-10 MED ORDER — LACTATED RINGERS IV SOLN
INTRAVENOUS | Status: DC | PRN
Start: 2019-10-10 — End: 2019-10-10

## 2019-10-10 MED ORDER — PROPOFOL 500 MG/50ML IV EMUL
INTRAVENOUS | Status: DC | PRN
Start: 2019-10-10 — End: 2019-10-10
  Administered 2019-10-10: 125 ug/kg/min via INTRAVENOUS

## 2019-10-10 MED ORDER — LIDOCAINE 2% (20 MG/ML) 5 ML SYRINGE
INTRAMUSCULAR | Status: AC
  Filled 2019-10-10: qty 10

## 2019-10-10 MED ORDER — GLYCOPYRROLATE 0.2 MG/ML IJ SOLN
0.2000 mg | Freq: Once | INTRAMUSCULAR | Status: AC
  Administered 2019-10-10: 0.2 mg via INTRAVENOUS
  Filled 2019-10-10: qty 1

## 2019-10-10 MED ORDER — LIDOCAINE VISCOUS HCL 2 % MT SOLN
OROMUCOSAL | Status: AC
  Filled 2019-10-10: qty 15

## 2019-10-10 NOTE — Transfer of Care (Signed)
Immediate Anesthesia Transfer of Care Note  Patient: [name]  Procedure(s) Performed: ESOPHAGOGASTRODUODENOSCOPY (EGD) WITH PROPOFOL (N/A ) MALONEY DILATION (N/A ) BIOPSY  Patient Location: PACU  Anesthesia Type:MAC and General  Level of Consciousness: awake and patient cooperative  Airway & Oxygen Therapy: Patient Spontanous Breathing  Post-op Assessment: Report given to RN and Post -op Vital signs reviewed and stable  Post vital signs: Reviewed and stable  Last Vitals:  Vitals Value Taken Time  BP    Temp    Pulse    Resp 19 10/10/19 1149  SpO2    Vitals shown include unvalidated device data.  Last Pain:  Vitals:   10/10/19 1115  PainSc: 0-No pain         Complications: No complications documented.

## 2019-10-10 NOTE — Progress Notes (Signed)
Please excuse [name] from work from Friday October 07, 2019 through Tuesday October 11, 2019 at 1:00pm. She cannot drive, operate heavy machinery , or sign legal documents following treatment through Tuesday October 11, 2019 at 1:00pm.

## 2019-10-10 NOTE — Interval H&P Note (Signed)
History and Physical Interval Note:  10/10/2019 11:21 AM  [name]  has presented today for surgery, with the diagnosis of dysphagia, gerd, abd pain.  The various methods of treatment have been discussed with the patient and family. After consideration of risks, benefits and other options for treatment, the patient has consented to  Procedure(s) with comments: ESOPHAGOGASTRODUODENOSCOPY (EGD) WITH PROPOFOL (N/A) - 11:00am MALONEY DILATION (N/A) as a surgical intervention.  The patient's history has been reviewed, patient examined, no change in status, stable for surgery.  I have reviewed the patient's chart and labs.  Questions were answered to the patient's satisfaction.     Eula Listen  Increasing Prilosec has not helped with her upper abdominal pain.  Dysphagia to pills only.  No solid food dysphagia.  Here for an EGD per plan.  Possible esophageal dilation as feasible/appropriate per plan.  She is on Plavix. The risks, benefits, limitations, alternatives and imponderables have been reviewed with the patient. Questions have been answered. All parties are agreeable.

## 2019-10-10 NOTE — Anesthesia Postprocedure Evaluation (Signed)
Anesthesia Post Note  Patient: [name]  Procedure(s) Performed: ESOPHAGOGASTRODUODENOSCOPY (EGD) WITH PROPOFOL (N/A ) MALONEY DILATION (N/A ) BIOPSY  Patient location during evaluation: PACU Anesthesia Type: General Level of consciousness: awake Pain management: pain level controlled Vital Signs Assessment: post-procedure vital signs reviewed and stable Respiratory status: spontaneous breathing Cardiovascular status: stable Postop Assessment: no apparent nausea or vomiting Anesthetic complications: no   No complications documented.   Last Vitals:  Vitals:   10/10/19 1015 10/10/19 1030  BP: (!) 122/61 112/65  Pulse: 55 56  Resp: 18 14  Temp:    SpO2: 95% 95%    Last Pain:  Vitals:   10/10/19 1115  PainSc: 0-No pain                 Edison Pace

## 2019-10-10 NOTE — Discharge Instructions (Signed)
EGD Discharge instructions Please read the instructions outlined below and refer to this sheet in the next few weeks. These discharge instructions provide you with general information on caring for yourself after you leave the hospital. Your doctor may also give you specific instructions. While your treatment has been planned according to the most current medical practices available, unavoidable complications occasionally occur. If you have any problems or questions after discharge, please call your doctor. ACTIVITY  You may resume your regular activity but move at a slower pace for the next 24 hours.   Take frequent rest periods for the next 24 hours.   Walking will help expel (get rid of) the air and reduce the bloated feeling in your abdomen.   No driving for 24 hours (because of the anesthesia (medicine) used during the test).   You may shower.   Do not sign any important legal documents or operate any machinery for 24 hours (because of the anesthesia used during the test).  NUTRITION  Drink plenty of fluids.   You may resume your normal diet.   Begin with a light meal and progress to your normal diet.   Avoid alcoholic beverages for 24 hours or as instructed by your caregiver.  MEDICATIONS  You may resume your normal medications unless your caregiver tells you otherwise.  WHAT YOU CAN EXPECT TODAY  You may experience abdominal discomfort such as a feeling of fullness or "gas" pains.  FOLLOW-UP  Your doctor will discuss the results of your test with you.  SEEK IMMEDIATE MEDICAL ATTENTION IF ANY OF THE FOLLOWING OCCUR:  Excessive nausea (feeling sick to your stomach) and/or vomiting.   Severe abdominal pain and distention (swelling).   Trouble swallowing.   Temperature over 101 F (37.8 C).   Rectal bleeding or vomiting of blood.   Your esophagus was stretched today  The nodule in your stomach which has been there for some time appears to be a little bit larger  than that described previously.  It appeared to be benign.  I did take biopsies.  Further recommendations to follow in the near future.\  At patient request, I called Robbie at [phone] -rolled to voicemail     Monitored Anesthesia Care, Care After These instructions provide you with information about caring for yourself after your procedure. Your health care provider may also give you more specific instructions. Your treatment has been planned according to current medical practices, but problems sometimes occur. Call your health care provider if you have any problems or questions after your procedure. What can I expect after the procedure? After your procedure, you may:  Feel sleepy for several hours.  Feel clumsy and have poor balance for several hours.  Feel forgetful about what happened after the procedure.  Have poor judgment for several hours.  Feel nauseous or vomit.  Have a sore throat if you had a breathing tube during the procedure. Follow these instructions at home: For at least 24 hours after the procedure:      Have a responsible adult stay with you. It is important to have someone help care for you until you are awake and alert.  Rest as needed.  Do not: ? Participate in activities in which you could fall or become injured. ? Drive. ? Use heavy machinery. ? Drink alcohol. ? Take sleeping pills or medicines that cause drowsiness. ? Make important decisions or sign legal documents. ? Take care of children on your own. Eating and drinking  Follow the diet  that is recommended by your health care provider.  If you vomit, drink water, juice, or soup when you can drink without vomiting.  Make sure you have little or no nausea before eating solid foods. General instructions  Take over-the-counter and prescription medicines only as told by your health care provider.  If you have sleep apnea, surgery and certain medicines can increase your risk for  breathing problems. Follow instructions from your health care provider about wearing your sleep device: ? Anytime you are sleeping, including during daytime naps. ? While taking prescription pain medicines, sleeping medicines, or medicines that make you drowsy.  If you smoke, do not smoke without supervision.  Keep all follow-up visits as told by your health care provider. This is important. Contact a health care provider if:  You keep feeling nauseous or you keep vomiting.  You feel light-headed.  You develop a rash.  You have a fever. Get help right away if:  You have trouble breathing. Summary  For several hours after your procedure, you may feel sleepy and have poor judgment.  Have a responsible adult stay with you for at least 24 hours or until you are awake and alert. This information is not intended to replace advice given to you by your health care provider. Make sure you discuss any questions you have with your health care provider. Document Revised: 05/25/2017 Document Reviewed: 06/17/2015 Elsevier Patient Education  2020 [employer].

## 2019-10-10 NOTE — Op Note (Signed)
[facility] Patient Name: [name] Procedure Date: 10/10/2019 11:04 AM MRN: [id number] Date of Birth: [birth year] Attending MD: Gennette Pac , MD CSN: [id number] Age: 54 Admit Type: Outpatient Procedure:                Upper GI endoscopy Indications:              Upper abdominal pain, Dysphagia Providers:                Gennette Pac, MD, Buel Ream. Thomasena Edis RN, RN,                            Dyann Ruddle Referring MD:              Medicines:                Propofol per Anesthesia Complications:            No immediate complications. Estimated Blood Loss:     Estimated blood loss was minimal. Procedure:                Pre-Anesthesia Assessment:                           - Prior to the procedure, a History and Physical                            was performed, and patient medications and                            allergies were reviewed. The patient's tolerance of                            previous anesthesia was also reviewed. The risks                            and benefits of the procedure and the sedation                            options and risks were discussed with the patient.                            All questions were answered, and informed consent                            was obtained. Prior Anticoagulants: The patient                            last took Plavix (clopidogrel) 1 day prior to the                            procedure. ASA Grade Assessment: II - A patient                            with mild systemic disease. After reviewing the  risks and benefits, the patient was deemed in                            satisfactory condition to undergo the procedure.                           After obtaining informed consent, the endoscope was                            passed under direct vision. Throughout the                            procedure, the patient's blood pressure, pulse, and                            oxygen  saturations were monitored continuously. The                            GIF-H190 ([id number]) scope was introduced through the                            mouth, and advanced to the second part of duodenum.                            The upper GI endoscopy was accomplished without                            difficulty. The patient tolerated the procedure                            well. Scope In: 11:30:49 AM Scope Out: 11:39:31 AM Total Procedure Duration: 0 hours 8 minutes 42 seconds  Findings:      The examined esophagus was normal. Prepyloric nodularity 1cm apical       nodule with a larger underlying submucosal mass-effect. Approximately 2       cm x 2 cm in dimensions. Appeared to be benign. Submucosal component.       Please see multiple photographs. Pylorus was patent. Examination of bulb       second portion revealed no abnormalities The scope was withdrawn.       Dilation was performed with a Maloney dilator with mild resistance at 54       Fr. The dilation site was examined following endoscope reinsertion and       showed no change. Estimated blood loss: none. Finally, the prepyloric       gastric nodule was biopsied multiple times. Good tissue samples       obtained. Minimal bleeding. Impression:               - Normal esophagus. Dilated.                           -Prepyloric antral nodule. Likely the lesion seen                            and described previously. This looked a little  different than a pancreatic rest but appeared to be                            benign. It appeared to be larger than described                            previously in the reports and larger than lesion                            photographed in 2013.                           It is unknown whether this lesion has anything to                            do with her abdominal pain; symptoms of abdominal                            pain are reported to be unchanged after  escalating                            dosing of acid suppression medication recently. Moderate Sedation:      Moderate (conscious) sedation was personally administered by an       anesthesia professional. The following parameters were monitored: oxygen       saturation, heart rate, blood pressure, respiratory rate, EKG, adequacy       of pulmonary ventilation, and response to care. Recommendation:           - Patient has a contact number available for                            emergencies. The signs and symptoms of potential                            delayed complications were discussed with the                            patient. Return to normal activities tomorrow.                            Written discharge instructions were provided to the                            patient.                           - Advance diet as tolerated. Office visit with [place]                            in 6 weeks. Follow-up on pathology. Procedure Code(s):        --- Professional ---                           [phone], Esophagogastroduodenoscopy, flexible,  transoral; diagnostic, including collection of                            specimen(s) by brushing or washing, when performed                            (separate procedure)                           43450, Dilation of esophagus, by unguided sound or                            bougie, single or multiple passes Diagnosis Code(s):        --- Professional ---                           R10.10, Upper abdominal pain, unspecified                           R13.10, Dysphagia, unspecified CPT copyright 2019 American Medical Association. All rights reserved. The codes documented in this report are preliminary and upon coder review may  be revised to meet current compliance requirements. Gerrit Friends. Jacquel Redditt, MD Gennette Pac, MD 10/10/2019 11:56:11 AM This report has been signed electronically. Number of Addenda: 0

## 2019-10-10 NOTE — Anesthesia Preprocedure Evaluation (Addendum)
Anesthesia Evaluation  Patient identified by MRN, date of birth, ID band Patient awake    Reviewed: Allergy & Precautions, NPO status , Patient's Chart, lab work & pertinent test results  History of Anesthesia Complications Negative for: history of anesthetic complications  Airway Mallampati: II  TM Distance: >3 FB Neck ROM: Full    Dental  (+) Dental Advisory Given, Missing, Chipped   Pulmonary Current Smoker and Patient abstained from smoking.,    Pulmonary exam normal breath sounds clear to auscultation       Cardiovascular Exercise Tolerance: Good hypertension, Pt. on medications + CAD, + Past MI and + Cardiac Stents   Rhythm:Regular Rate:Normal     Neuro/Psych  Neuromuscular disease    GI/Hepatic hiatal hernia, GERD  Medicated and Controlled,  Endo/Other  diabetes, Well Controlled, Type 2, Oral Hypoglycemic AgentsHypothyroidism   Renal/GU      Musculoskeletal   Abdominal   Peds  Hematology   Anesthesia Other Findings   Reproductive/Obstetrics                            Anesthesia Physical Anesthesia Plan  ASA: III  Anesthesia Plan: General   Post-op Pain Management:    Induction: Intravenous  PONV Risk Score and Plan: TIVA and Treatment may vary due to age or medical condition  Airway Management Planned: Nasal Cannula, Natural Airway and Simple Face Mask  Additional Equipment:   Intra-op Plan:   Post-operative Plan:   Informed Consent: I have reviewed the patients History and Physical, chart, labs and discussed the procedure including the risks, benefits and alternatives for the proposed anesthesia with the patient or authorized representative who has indicated his/her understanding and acceptance.     Dental advisory given  Plan Discussed with: CRNA  Anesthesia Plan Comments:         Anesthesia Quick Evaluation

## 2019-10-11 ENCOUNTER — Other Ambulatory Visit: Payer: Self-pay

## 2019-10-11 LAB — SURGICAL PATHOLOGY

## 2019-10-12 ENCOUNTER — Telehealth: Payer: Self-pay

## 2019-10-12 ENCOUNTER — Encounter: Payer: Self-pay | Admitting: Internal Medicine

## 2019-10-12 NOTE — Telephone Encounter (Signed)
Per Dr.Rourk- Send letter to patient.  Send copy of letter with path to referring provider and PCP.  OV scheduled 6 weeks.  Will need to decide at that time whether or not patient needs to go back to see Dr. Dulce Sellar for repeat EUS.

## 2019-10-13 NOTE — Telephone Encounter (Signed)
PA approved for [id number] ref# [id number] dates 10/10/19-01/10/2020. COde [id number] does not require review

## 2019-10-14 ENCOUNTER — Encounter (HOSPITAL_COMMUNITY): Payer: Self-pay | Admitting: Internal Medicine

## 2019-10-17 ENCOUNTER — Ambulatory Visit: Payer: BC Managed Care – PPO | Admitting: Gastroenterology

## 2019-10-26 NOTE — Telephone Encounter (Signed)
OV made °

## 2019-11-24 ENCOUNTER — Encounter: Payer: Self-pay | Admitting: Nurse Practitioner

## 2019-11-24 ENCOUNTER — Ambulatory Visit: Payer: BC Managed Care – PPO | Admitting: Nurse Practitioner

## 2019-11-24 ENCOUNTER — Other Ambulatory Visit: Payer: Self-pay

## 2019-11-24 VITALS — BP 111/67 | HR 64 | Temp 97.9°F | Ht 59.0 in | Wt 167.6 lb

## 2019-11-24 DIAGNOSIS — K219 Gastro-esophageal reflux disease without esophagitis: Secondary | ICD-10-CM | POA: Diagnosis not present

## 2019-11-24 DIAGNOSIS — R197 Diarrhea, unspecified: Secondary | ICD-10-CM | POA: Diagnosis not present

## 2019-11-24 DIAGNOSIS — R109 Unspecified abdominal pain: Secondary | ICD-10-CM | POA: Diagnosis not present

## 2019-11-24 DIAGNOSIS — R112 Nausea with vomiting, unspecified: Secondary | ICD-10-CM

## 2019-11-24 NOTE — Patient Instructions (Addendum)
Your health issues we discussed today were:   GERD (reflux/heartburn): 1. Roger reflux is doing well 2. Continue taking your Prilosec as you have been 3. Let us know if you have any worsening or severe symptoms  Abdominal pain with nausea/vomiting and bloating: 1. As we discussed we have done multiple tests including labs, CT scan, gastric emptying test, and upper endoscopy 2. Most of your testing is appeared normal 3. 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 I am printing more information below about dumping syndrome as well as a diet and other recommendations to help with your symptoms 5. Call us if you have any worsening or severe symptoms  Overall I recommend:  1. Continue your other current medications 2. Return for follow-up in 4 to 6 weeks 3. Call us again any questions or concerns   ---------------------------------------------------------------  I am glad you have gotten your COVID-19 vaccination!  Even though you are fully vaccinated you should continue to follow CDC and state/local guidelines.  ---------------------------------------------------------------   At [facility] we value your feedback. You may receive a survey about your visit today. Please share your experience as we strive to create trusting relationships with our patients to provide genuine, compassionate, quality care.  We appreciate your understanding and patience as we review any laboratory studies, imaging, and other diagnostic tests that are ordered as we care for you. Our office policy is 5 business days for review of these results, and any emergent or urgent results are addressed in a timely manner for your best interest. If you do not hear from our office in 1 week, please contact us.   We also encourage the use of MyChart, which contains your medical information for your review  as well. If you are not enrolled in this feature, an access code is on this after visit summary for your convenience. Thank you for allowing [place] to be involved in your care.  It was great to see you today!  I hope you have a great rest of your summer!!      Dumping Syndrome Dumping syndrome is a combination of symptoms that occur when food passes through the stomach too quickly. This is most often caused by weight loss (bariatric) or stomach (gastric) surgery. There are two types of dumping syndrome, and some people have both types:  Early dumping syndrome causes symptoms that occur soon after eating (10-30 minutes).  Late dumping syndrome causes symptoms that occur a few hours after eating (2-3 hours). When you eat and drink, your stomach needs time to digest everything. With dumping syndrome, undigested food and stomach fluids move too quickly into the small intestine, causing symptoms of early dumping syndrome. In other cases, large amounts of sugar move into the small intestine, which causes the pancreas to release too much of the hormone insulin. As a result, your blood sugar can get too low (hypoglycemia), and you may get symptoms of late dumping syndrome. What are the causes?  Early dumping syndrome is caused by large amounts of undigested food and liquids passing into the upper part of your small intestine.  Late dumping syndrome is caused by large amounts of sugar from your diet moving into your small intestine. What increases the risk? Certain types of stomach surgery can lead to dumping syndrome. You may be at risk for dumping syndrome if you have had:  Surgery for ulcers or stomach cancer.  Weight  loss surgery (bariatric surgery).  Surgery to treat severe heartburn (gastroesophageal reflux). What are the signs or symptoms? Early dumping syndrome Symptoms of early dumping syndrome may include:  Nausea.  Fullness or bloating.  Cramps.  Vomiting.  Feeling  flushed.  Sweating.  Feeling dizzy.  A fast or irregular heartbeat (palpitations).  Diarrhea.  Headache. Late dumping syndrome Symptoms of late dumping syndrome may include:  Dizziness.  Weakness.  Sweating.  Palpitations.  Hunger.  Confusion. How is this diagnosed? This condition may be diagnosed based on:  Your symptoms and medical history.  A physical exam.  Tests, such as: ? A modified glucose tolerance test. This blood test measures insulin secretion after you have a certain type of sugary (glucose) drink. ? Gastric emptying test. This test involves eating a bland meal that includes a material that shows up easily on an X-ray. After eating the meal, you have X-rays to find out how long it takes the stomach to empty. ? Upper endoscopy. In this exam, a health care provider puts a flexible telescope (endoscope) through your mouth and throat to check the inside of your stomach. How is this treated? For most people, this condition can be managed with diet changes. You may need to change your diet, nutrition, and eating habits. Working with a dietitian can be helpful. Other treatments may include:  Medicine that slows down stomach emptying and reduces insulin secretion. This medicine may be given by injection.  Medicine that slows down the digestion of sugar. This medicine is usually used to treat type 2 diabetes. Sometimes it can also relieve symptoms of late dumping syndrome.  Surgery. For severe cases of dumping syndrome, reconstructive stomach surgery may be needed if other treatments are not working. Follow these instructions at home: Eating strategies  Avoid drinking liquids for 30 minutes before meals.  Avoid drinking liquids for at least 30 minutes after meals.  Lie down for 15-30 minutes after meals. This will help to prevent light-headedness and will slow down the emptying of your stomach.  Eat smaller portions of food. Cut food into small pieces and chew  thoroughly before you swallow.  Work with a Data processing manager to change your diet and maintain good nutrition. What to eat and drink   Try to make sure that at least half of your daily servings of grains come from whole grains. These foods are high in fiber, which helps to slow down digestion.  Eat more complex carbohydrates, such as whole grains, vegetables, and beans.  Avoid eating simple carbohydrates and foods that have added sugar, such as fruit juices, white bread, pastries, and sweets. Foods that are high in sugar are especially likely to bring about symptoms.  With each meal and snack, eat one food that is high in protein.  Avoid dairy products if they aggravate your symptoms. Try lactose-free dairy products instead.  Avoid drinking alcohol.  Take a fiber supplement if recommended by your health care provider.  Increase the thickness of foods by adding plant-based thickening agents, such as pectin.  Avoid eating very hot or very cold foods. They may aggravate your symptoms. General instructions  Take over-the-counter and prescription medicines only as told by your health care provider.  Keep all follow-up visits as told by your health care provider. This is important. Contact a health care provider if you:  Notice that your symptoms change or get worse.  Find that diet changes do not help your symptoms.  Have trouble maintaining a healthy weight. Get help right  away if you:  Become dehydrated.  Have severe dizziness.  Notice that your heartbeat becomes very irregular. Summary  Dumping syndrome is a combination of symptoms that occur when food passes through the stomach too quickly.  Symptoms may include nausea, vomiting, diarrhea, dizziness, weakness, or hunger.  This condition is often treated with dietary changes and medicines.  Work with a Data processing manager to change your diet and maintain good nutrition. This information is not intended to replace advice given to you by  your health care provider. Make sure you discuss any questions you have with your health care provider. Document Revised: 05/25/2017 Document Reviewed: 12/30/2016 Elsevier Patient Education  2020 [employer]    Eating Plan for Dumping Syndrome Dumping syndrome is a term that describes a group of symptoms that occur when the stomach empties too quickly. This most often happens after weight loss (bariatric) or stomach (gastric) surgery. Changing how and what you eat can often help to control these symptoms. Work with your health care provider or a diet and nutrition specialist (dietitian) to make specific changes to what and how you eat to help prevent and treat symptoms of dumping syndrome. What are tips for following this plan? Reading food labels  Check food labels for added sugar. Avoid eating simple carbohydrates and foods that have added sugar, such as fruit juices, white bread, pastries, and sweets. Foods that are high in sugar are especially likely to cause symptoms.  Check food ingredients for whole grains. Try to make sure that at least half of your daily servings of grains come from whole grains. These foods are high in fiber, which helps to slow down digestion. Shopping  Avoid dairy products if they aggravate your symptoms. Try lactose-free dairy products instead.  Buy and eat foods that have complex carbohydrates, such as whole grains, vegetables, and beans.  Avoid processed foods. These may have added sugar and fat. Cooking  Your health care provider or dietitian may suggest that you increase the thickness of foods by adding food thickening products, such as pectin or corn starch. Follow instructions from your health care provider or dietitian on how to thicken foods. Meal planning  Eat 5-6 small meals each day instead of 3 large meals.  Do not drink liquids with meals. ? Avoid drinking liquids for 30 minutes before meals. ? Avoid drinking liquids for at least 30 minutes  after meals.  Eat a balanced diet. A balanced diet includes: ? 6-8 oz of grains each day. ? 2-3 cups of vegetables each day. ? 1-2 cups of fruit each day. ? 2-3 cups of dairy each day. ? Your dietitian may determine how much meat and other protein foods you should eat based on your body weight.  Eat one food that is high in protein with each meal and snack. Lifestyle   Do not drink alcohol.  Lie down for 15-30 minutes after meals. This will help to prevent light-headedness and will slow down the emptying of your stomach.  Follow your health care provider's instructions about what fluids you should avoid. Generally, you should avoid drinks that have: ? Sugar. ? Carbonation. ? Caffeine. General information  Avoid eating very hot or very cold foods. These may aggravate your symptoms.  Cut food into small pieces and chew thoroughly before you swallow.  Take a fiber supplement if recommended by your health care provider.  Take protein or other nutritional supplements as told by your health care provider or dietitian.  Take over-the-counter and prescription medicines only  as told by your health care provider. What foods can I eat?  Fruits All fresh, frozen, and canned fruits that are packed in water. Unsweetened applesauce. Vegetables All fresh, frozen, and canned vegetables. Grains Whole-grain bread. Brown rice. Whole-grain pasta. Corn tortillas. Multigrain crackers. Meats and other proteins Lean meats. Chicken. Fish. Eggs. Tofu. Deli meat. Nuts and seeds. Unsweetened nut butters. Beans. Dairy Low-fat or fat-free milk. Low-fat plain yogurt. Light yogurt. Cheese. Lactose-free dairy products. Beverages Water. Sugar-free, non-carbonated soft drinks. Unsweetened decaf tea. Unsweetened decaf coffee. Sugar-free hot chocolate. Unsweetened soy, almond, or rice milk. Condiments Sugar substitutes. Sugar-free jam and sugar-free jelly. Sugar-free syrup. Sweets and desserts Sugar-free  gelatin. Sugar-free pudding. Sugar-free ice cream. Fats and oils Butter. Vegetable oil. Salad dressings. Mayonnaise. Avocado. Cream cheese. Other Broth. All spices and herbs. Over-the-counter fiber supplements. The items listed above may not be a complete list of foods and beverages you can eat. Contact a dietitian for more options. What foods are not recommended? Fruits Fruits that are dried with added sugar. Canned fruit in syrup. Fruit juice. Vegetables Breaded or fried vegetables. Grains Cereals with added sugar. Pastries. Cakes. Sweet and white breads. Flavored instant oatmeal. Meats and other proteins Breaded or fried meats. Meats that are cooked with glazes or sweetened sauces. Dairy Flavored milk. Sugar-sweetened yogurt. Milkshakes. Beverages Hot chocolate. Regular sugar carbonated beverages. Diet carbonated beverages. Sweetened coconut milk. Sweetened coffee drinks. Caffeinated coffee or tea. Condiments Honey. Sugar. Syrup. Jam and jelly. Molasses. Agave nectar. Sweets and desserts Cakes. Candies. Ice Cream. Donuts. Puddings. Pies. Chocolate. Fats and oils Frostings. Sweetened nut butters. Other Candied nuts. Candied fruit. The items listed above may not be a complete list of foods and beverages you should avoid. Contact a dietitian for more information. Summary  Dumping syndrome is a term that is used to describe a group of symptoms that occur when the stomach empties too quickly. Changing how and what you eat can often help to control these symptoms.  Your health care provider or dietitian may recommend specific changes to your diet to help prevent and treat symptoms of dumping syndrome. General recommendations include eating small meals, not drinking liquids 30 minutes before or after a meal, and avoiding sugar. This information is not intended to replace advice given to you by your health care provider. Make sure you discuss any questions you have with your health care  provider. Document Revised: 06/17/2018 Document Reviewed: 02/25/2017 Elsevier Patient Education  2020 [employer].

## 2019-11-24 NOTE — Progress Notes (Signed)
Referring Provider: Rebekah Chesterfield, NP Primary Care Physician:  Rebekah Chesterfield, NP Primary GI:  Dr. Jena Gauss  Chief Complaint  Patient presents with  . Abdominal Pain    LUQ and swells when she eats and pain can travel into back. egd done 8/2.  Marland Kitchen Bloated    when eating  . Gastroesophageal Reflux    doing fine    HPI:   [name] is a 54 y.o. female who presents for follow-up on abdominal pain and GERD.  The patient was last seen in our office 09/14/2019 for abdominal pain, GERD, dysphagia, constipation.  Noted history of bloating and constipation.  Colonoscopy up-to-date 2016, recent EUS due to gastric nodule remain unchanged.  GES in February 2017 with accelerated gastric emptying.  CTA negative for mesenteric ischemia but noted upper abdominal ventral hernia status post incisional hernia repair with mesh.  Has been seen by [facility] for chronic abdominal pain at the site of the hernia felt may be due to to a deep suture causing symptoms.  Recent emergency department visit 09/09/2019 for abdominal pain that was moderate, gradual in onset with associated nausea and vomiting.  Labs were essentially normal and CT of the abdomen and pelvis was essentially unremarkable.  At her last visit she was having persistent left upper quadrant abdominal burning pain associated with nausea and gas, worsening over the previous 6 months.  Symptoms are worse postprandial but also does have symptoms without eating.  Worsening abdominal pain when she goes to gym.  Still on PPI with a regular bowel movement and no straining.  Last episode of vomiting 2 days prior that look oily.  No hematemesis or melena.  Noted solid food dysphagia.  On Prilosec daily with no other PPIs attempted prior.  Saw cardiology and work-up deemed noncardiac pain.  Denies NSAIDs.  Recommended increase Prilosec to 40 mg twice daily, Carafate 4 times a day as needed for breakthrough, EGD, continue  other medications for constipation, follow-up in 2 months.  EGD was completed 10/10/2019 which found normal esophagus status post dilation, prepyloric antral nodule likely the previously described lesion.  The lesion appeared to be somewhat larger than photographed in 2013 and unknown if this is having any contribution towards her abdominal symptoms.  The nodule is biopsied.  Surgical pathology found reactive gastropathy with ulceration and reactive atypia, no H. Pylori.  Today she states she is still having ongoing symptoms. GERD doing ok on Prilosec daily, no breakthrough symptoms. Still with LUQ abdominal swelling postprandial. States she feels like it's a baby moving in there. Sometimes hard to work. Symptoms are less when she hasn't eaten. States her husband feels it. Starts LUQ and radiates around to her left flank. Has nausea, but no vomiting; random and unrelated to her other symptoms. Has had a cholecystectomy. Denies hematochezia. States sometimes with a bowel movement it appears to have undigested food. Has a bowel movement about 3 times a day, sometimes formed but sometimes loose stool (which is when she tends to see undigested food.) Denies melena, fever, chills, unintentional weight loss. Denies URI or flu-like symptoms. Denies loss of sense of taste or smell. The patient has received COVID-19 vaccination(s). Denies chest pain, dyspnea, dizziness, lightheadedness, syncope, near syncope. Denies any other upper or lower GI symptoms.  States she avoids sugar/sweets. Likes fruit. Eats about 1-2 meals a day, not eating much.  Past Medical History:  Diagnosis Date  . Diabetes mellitus   .  GERD (gastroesophageal reflux disease)   . H/O hiatal hernia   . High cholesterol   . History of kidney stones   . Hypertension   . Hypothyroidism   . MI (myocardial infarction) (HCC) 2015  . Neuromuscular disorder (HCC)    neuropathy; carpal tunnel BOTH WRISTS    Past Surgical History:  Procedure  Laterality Date  . BIOPSY  10/10/2019   Procedure: BIOPSY;  Surgeon: Corbin Ade, MD;  [facility];  Service: Endoscopy;;  . CESAREAN SECTION     X 3  . CHOLECYSTECTOMY    . COLONOSCOPY  April 2016   Dr. Teena Dunk: diverticulum in ascending colon, small internal hemorrhoids,   . COLONOSCOPY  2013   Dr. Randa Evens: normal mucosa, internal hemorrhoids. path: benign colonic mucosa  . COLONOSCOPY WITH ESOPHAGOGASTRODUODENOSCOPY (EGD)     Dr. Randa Evens.   . CORONARY ANGIOPLASTY WITH STENT PLACEMENT  2015   STENT X 1  . ESOPHAGOGASTRODUODENOSCOPY N/A 04/11/2015   Dr. Outlaw:Gastric nodule   . ESOPHAGOGASTRODUODENOSCOPY  2013   Dr. Randa Evens: normal esophagus. medium-sized submucosal mass with no bleeding and no stigmata of recent bleeding, s/p biopsy. path: benign small bowel mucosa, surgical polyp with polypoid reactive-type gastropathy, negative h.pylori   . ESOPHAGOGASTRODUODENOSCOPY (EGD) WITH PROPOFOL N/A 10/10/2019   Procedure: ESOPHAGOGASTRODUODENOSCOPY (EGD) WITH PROPOFOL;  Surgeon: Corbin Ade, MD;  [facility];  Service: Endoscopy;  Laterality: N/A;  11:00am  . EUS  11/18/2011   Dr. Dulce Sellar: gastric nodule, suspect benign leiomyoma or focal thicekning of muscularis propria with extension to the pyloric channel. Consider repeat EUS in 1-2 years.   . EUS N/A 04/11/2015   Dr. Dulce Sellar: 10X97mm nodule, no significant change in size since last EUS Sept 2013. no further surveillance needed   . INCISIONAL HERNIA REPAIR N/A 06/08/2015   Procedure: Sherald Hess HERNIORRHAPHY WITH MESH;  Surgeon: Franky Macho, MD;  Location: [facility];  Service: General;  Laterality: N/A;  . INSERTION OF MESH N/A 06/08/2015   Procedure: INSERTION OF MESH;  Surgeon: Franky Macho, MD;  Location: [facility];  Service: General;  Laterality: N/A;  Elease Hashimoto DILATION N/A 10/10/2019   Procedure: Elease Hashimoto DILATION;  Surgeon: Corbin Ade, MD;  [facility];  Service: Endoscopy;  Laterality: N/A;  . UTERINE  ABLATION      Current Outpatient Medications  Medication Sig Dispense Refill  . aspirin EC 81 MG tablet Take 81 mg by mouth daily.    . bumetanide (BUMEX) 1 MG tablet Take 1 mg by mouth daily.    . clopidogrel (PLAVIX) 75 MG tablet Take 75 mg by mouth daily. Reported on 02/28/2015    . dapagliflozin propanediol (FARXIGA) 10 MG TABS tablet Take 10 mg by mouth daily.    Marland Kitchen dicyclomine (BENTYL) 20 MG tablet Take 20 mg by mouth in the morning and at bedtime.     Marland Kitchen escitalopram (LEXAPRO) 10 MG tablet Take 10 mg by mouth daily.    Marland Kitchen estradiol (ESTRACE) 1 MG tablet Take 1 mg by mouth daily.    Marland Kitchen gabapentin (NEURONTIN) 300 MG capsule Take 300 mg by mouth 2 (two) times daily.    Marland Kitchen glipiZIDE (GLUCOTROL XL) 10 MG 24 hr tablet Take 10 mg by mouth daily.    Marland Kitchen icosapent Ethyl (VASCEPA) 1 g capsule Take 2 g by mouth 2 (two) times daily.    . insulin degludec (TRESIBA) 100 UNIT/ML FlexTouch Pen Inject 30 Units into the skin daily.    Marland Kitchen levothyroxine (  SYNTHROID) 75 MCG tablet Take 75 mcg by mouth daily before breakfast.    . linaclotide (LINZESS) 290 MCG CAPS capsule Take 290 mcg by mouth daily before breakfast.    . losartan-hydrochlorothiazide (HYZAAR) 50-12.5 MG tablet Take 1 tablet by mouth daily.    . metFORMIN (GLUCOPHAGE) 1000 MG tablet Take 1,000 mg by mouth 2 (two) times daily with a meal.     . nitroGLYCERIN (NITROSTAT) 0.4 MG SL tablet Place 0.4 mg under the tongue every 5 (five) minutes as needed for chest pain.     Marland Kitchen omeprazole (PRILOSEC) 40 MG capsule Take 40 mg by mouth in the morning and at bedtime.     Marland Kitchen oxyCODONE (ROXICODONE) 15 MG immediate release tablet Take 15 mg by mouth in the morning, at noon, in the evening, and at bedtime.    Marland Kitchen spironolactone (ALDACTONE) 25 MG tablet Take 25 mg by mouth daily.    Marland Kitchen umeclidinium-vilanterol (ANORO ELLIPTA) 62.5-25 MCG/INH AEPB Inhale 1 puff into the lungs daily.     No current facility-administered medications for this visit.    Allergies as of  11/24/2019  . (No Known Allergies)    Family History  Problem Relation Age of Onset  . Colon cancer Neg Hx     Social History   Socioeconomic History  . Marital status: Married    Spouse name: Not on file  . Number of children: Not on file  . Years of education: Not on file  . Highest education level: Not on file  Occupational History  . Not on file  Tobacco Use  . Smoking status: Current Every Day Smoker    Packs/day: 0.50    Types: Cigarettes  . Smokeless tobacco: Never Used  Vaping Use  . Vaping Use: Never used  Substance and Sexual Activity  . Alcohol use: No    Alcohol/week: 0.0 standard drinks  . Drug use: No  . Sexual activity: Not on file  Other Topics Concern  . Not on file  Social History Narrative  . Not on file   Social Determinants of Health   Financial Resource Strain:   . Difficulty of Paying Living Expenses: Not on file  Food Insecurity:   . Worried About Programme researcher, broadcasting/film/video in the Last Year: Not on file  . Ran Out of Food in the Last Year: Not on file  Transportation Needs:   . Lack of Transportation (Medical): Not on file  . Lack of Transportation (Non-Medical): Not on file  Physical Activity:   . Days of Exercise per Week: Not on file  . Minutes of Exercise per Session: Not on file  Stress:   . Feeling of Stress : Not on file  Social Connections:   . Frequency of Communication with Friends and Family: Not on file  . Frequency of Social Gatherings with Friends and Family: Not on file  . Attends Religious Services: Not on file  . Active Member of Clubs or Organizations: Not on file  . Attends Banker Meetings: Not on file  . Marital Status: Not on file    Subjective: Review of Systems  Constitutional: Negative for chills, fever, malaise/fatigue and weight loss.  HENT: Negative for congestion and sore throat.   Respiratory: Negative for cough and shortness of breath.   Cardiovascular: Negative for chest pain and  palpitations.  Gastrointestinal: Negative for abdominal pain, blood in stool, diarrhea, melena, nausea and vomiting.  Musculoskeletal: Negative for joint pain and myalgias.  Skin: Negative for  rash.  Neurological: Negative for dizziness and weakness.  Endo/Heme/Allergies: Does not bruise/bleed easily.  Psychiatric/Behavioral: Negative for depression. The patient is not nervous/anxious.   All other systems reviewed and are negative.    Objective: BP 111/67   Pulse 64   Temp 97.9 F (36.6 C) (Oral)   Ht 4\' 11"  (1.499 m)   Wt 167 lb 9.6 oz (76 kg)   BMI 33.85 kg/m  Physical Exam Vitals and nursing note reviewed.  Constitutional:      General: She is not in acute distress.    Appearance: Normal appearance. She is well-developed. She is not ill-appearing, toxic-appearing or diaphoretic.  HENT:     Head: Normocephalic and atraumatic.     Nose: No congestion or rhinorrhea.  Eyes:     General: No scleral icterus. Cardiovascular:     Rate and Rhythm: Normal rate and regular rhythm.     Heart sounds: Normal heart sounds.  Pulmonary:     Effort: Pulmonary effort is normal. No respiratory distress.     Breath sounds: Normal breath sounds.  Abdominal:     General: Bowel sounds are normal.     Palpations: Abdomen is soft. There is no hepatomegaly, splenomegaly or mass.     Tenderness: There is no abdominal tenderness. There is no guarding or rebound.     Hernia: No hernia is present.  Skin:    General: Skin is warm and dry.     Coloration: Skin is not jaundiced.     Findings: No rash.  Neurological:     General: No focal deficit present.     Mental Status: She is alert and oriented to person, place, and time.  Psychiatric:        Attention and Perception: Attention normal.        Mood and Affect: Mood normal.        Speech: Speech normal.        Behavior: Behavior normal.        Thought Content: Thought content normal.        Cognition and Memory: Cognition and memory normal.        Assessment:  Very pleasant 54 year old female presents for persistent abdominal pain, diarrhea, nausea/vomiting.  She has had an extensive work-up as outlined above including labs, CT imaging, CTA, and endoscopy without any significant findings.  She did have a gastric emptying study which found quite a rapid gastric emptying at 87% emptied after 1 hour.  Based on her symptoms, progression, no improvement with antireflux measures she may have an element or form of dumping syndrome.  I discussed this with her included symptoms that seem applicable to her current complaints.  Feels this point it would be best to try to implement a diet recommendation related to dumping syndrome to see if we can get any improvement.  I will have her continue her other medications in the interim with a short interval follow-up.   Plan: 1. Start implement a diet for dumping syndrome.  I discussed these recommendations with her and provided handout information as well. 2. Offered referral to dietitian if/when needed/wanted for further assistance, she declined at this time 3. Continue current medications 4. Return for follow-up in 4 to 6 weeks    Thank you for allowing 40 to participate in the care of [name]  [place], DNP, AGNP-C Adult & Gerontological Nurse Practitioner [facility]   11/24/2019 10:35 AM   Disclaimer: This note was dictated with voice recognition  software. Similar sounding words can inadvertently be transcribed and may not be corrected upon review.

## 2019-12-30 ENCOUNTER — Ambulatory Visit: Payer: BC Managed Care – PPO | Admitting: Internal Medicine

## 2019-12-30 ENCOUNTER — Encounter: Payer: Self-pay | Admitting: Internal Medicine

## 2019-12-30 ENCOUNTER — Other Ambulatory Visit: Payer: Self-pay

## 2019-12-30 VITALS — BP 114/72 | HR 65 | Temp 97.3°F | Ht 59.0 in | Wt 167.6 lb

## 2019-12-30 DIAGNOSIS — K5900 Constipation, unspecified: Secondary | ICD-10-CM | POA: Diagnosis not present

## 2019-12-30 DIAGNOSIS — K219 Gastro-esophageal reflux disease without esophagitis: Secondary | ICD-10-CM

## 2019-12-30 DIAGNOSIS — R109 Unspecified abdominal pain: Secondary | ICD-10-CM | POA: Diagnosis not present

## 2019-12-30 NOTE — Progress Notes (Signed)
Primary Care Physician:  Rebekah Chesterfield, NP Primary Gastroenterologist:  Dr.   Pre-Procedure History & Physical: HPI:  [name] is a 54 y.o. female here for further evaluation of 8-year history of a left upper quadrant abdominal pain with some perceived bloating; there is a positional/ movement component.  Not necessarily related to meals or having a bowel movement.  Reflux well controlled on omeprazole 40 mg twice daily.  No dysphagia.  She has gained 20 pounds since earlier in the year.  Chronic constipation well managed with Linzess 290 every other day.  She is not seeing any blood.  Negative colonoscopy with Dr. Teena Dunk 2017.  Not only seen by Dr. Teena Dunk and our practice, she has been seen by Drs. Edwards and [place] in [place] with the same complaint.  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  She denies ever having a rash in the area of concern.  She has peripheral neuropathy  - hemoglobin A1c as high as 11.  Recent contrast CT of the abdomen demonstrated a nonobstructing left renal stone.  No other abnormalities to explain her pain.  In addition to losing her father a year ago, she just lost her mother 3 weeks ago.  She is having trouble dealing with her loss.  Dr. Derek Jack just started her on trazodone nightly a couple of weeks ago.  She is sleeping poorly.  Dysphagia has resolved after esophagus was empirically dilated by me earlier in the year.   Takes Roxicodone as needed.   Past Medical History:  Diagnosis Date  . Diabetes mellitus   . GERD (gastroesophageal reflux disease)   . H/O hiatal hernia   . High cholesterol   . History of kidney stones   . Hypertension   . Hypothyroidism   . MI (myocardial infarction) (HCC) 2015  . Neuromuscular disorder (HCC)    neuropathy; carpal tunnel BOTH WRISTS    Past Surgical History:  Procedure  Laterality Date  . BIOPSY  10/10/2019   Procedure: BIOPSY;  Surgeon: Corbin Ade, MD;  [facility];  Service: Endoscopy;;  . CESAREAN SECTION     X 3  . CHOLECYSTECTOMY    . COLONOSCOPY  April 2016   Dr. Teena Dunk: diverticulum in ascending colon, small internal hemorrhoids,   . COLONOSCOPY  2013   Dr. Randa Evens: normal mucosa, internal hemorrhoids. path: benign colonic mucosa  . COLONOSCOPY WITH ESOPHAGOGASTRODUODENOSCOPY (EGD)     Dr. Randa Evens.   . CORONARY ANGIOPLASTY WITH STENT PLACEMENT  2015   STENT X 1  . ESOPHAGOGASTRODUODENOSCOPY N/A 04/11/2015   Dr. Outlaw:Gastric nodule   . ESOPHAGOGASTRODUODENOSCOPY  2013   Dr. Randa Evens: normal esophagus. medium-sized submucosal mass with no bleeding and no stigmata of recent bleeding, s/p biopsy. path: benign small bowel mucosa, surgical polyp with polypoid reactive-type gastropathy, negative h.pylori   . ESOPHAGOGASTRODUODENOSCOPY (EGD) WITH PROPOFOL N/A 10/10/2019   Procedure: ESOPHAGOGASTRODUODENOSCOPY (EGD) WITH PROPOFOL;  Surgeon: Corbin Ade, MD;  [facility];  Service: Endoscopy;  Laterality: N/A;  11:00am  . EUS  11/18/2011   Dr. Dulce Sellar: gastric nodule, suspect benign leiomyoma or focal thicekning of muscularis propria with extension to the pyloric channel. Consider repeat EUS in 1-2 years.   . EUS N/A 04/11/2015   Dr. Dulce Sellar: 10X61mm nodule, no significant change in size since last EUS Sept 2013. no  further surveillance needed   . INCISIONAL HERNIA REPAIR N/A 06/08/2015   Procedure: Sherald Hess HERNIORRHAPHY WITH MESH;  Surgeon: Franky Macho, MD;  Location: [facility];  Service: General;  Laterality: N/A;  . INSERTION OF MESH N/A 06/08/2015   Procedure: INSERTION OF MESH;  Surgeon: Franky Macho, MD;  Location: [facility];  Service: General;  Laterality: N/A;  Elease Hashimoto DILATION N/A 10/10/2019   Procedure: Elease Hashimoto DILATION;  Surgeon: Corbin Ade, MD;  [facility];  Service: Endoscopy;  Laterality: N/A;  . UTERINE  ABLATION      Prior to Admission medications   Medication Sig Start Date End Date Taking? Authorizing Provider  aspirin EC 81 MG tablet Take 81 mg by mouth daily.   Yes [provider]  bumetanide (BUMEX) 1 MG tablet Take 1 mg by mouth daily.   Yes [provider]  clopidogrel (PLAVIX) 75 MG tablet Take 75 mg by mouth daily. Reported on 02/28/2015   Yes [provider]  dapagliflozin propanediol (FARXIGA) 10 MG TABS tablet Take 10 mg by mouth daily.   Yes [provider]  dicyclomine (BENTYL) 20 MG tablet Take 20 mg by mouth in the morning, at noon, and at bedtime.    Yes [provider]  escitalopram (LEXAPRO) 10 MG tablet Take 10 mg by mouth daily. 09/03/19  Yes [provider]  estradiol (ESTRACE) 1 MG tablet Take 1 mg by mouth daily.   Yes [provider]  gabapentin (NEURONTIN) 300 MG capsule Take 300 mg by mouth 2 (two) times daily.   Yes [provider]  glipiZIDE (GLUCOTROL XL) 10 MG 24 hr tablet Take 10 mg by mouth daily. 09/03/19  Yes [provider]  icosapent Ethyl (VASCEPA) 1 g capsule Take 2 g by mouth 2 (two) times daily.   Yes [provider]  insulin degludec (TRESIBA) 100 UNIT/ML FlexTouch Pen Inject 30 Units into the skin daily.   Yes [provider]  levothyroxine (SYNTHROID) 75 MCG tablet Take 75 mcg by mouth daily before breakfast.   Yes [provider]  linaclotide (LINZESS) 290 MCG CAPS capsule Take 290 mcg by mouth daily before breakfast.   Yes [provider]  losartan-hydrochlorothiazide (HYZAAR) 50-12.5 MG tablet Take 1 tablet by mouth daily.   Yes [provider]  metFORMIN (GLUCOPHAGE) 1000 MG tablet Take 1,000 mg by mouth 2 (two) times daily with a meal.    Yes [provider]  nitroGLYCERIN (NITROSTAT) 0.4 MG SL tablet Place 0.4 mg under the tongue every 5 (five) minutes as needed for chest pain.  12/25/13  Yes [provider]  omeprazole (PRILOSEC) 40 MG capsule Take 40 mg by mouth in the morning and at bedtime.    Yes [provider]  oxyCODONE (ROXICODONE) 15 MG immediate release tablet Take 15 mg by mouth in the morning, at noon, in the evening, and at bedtime.   Yes [provider]  spironolactone (ALDACTONE) 25 MG tablet Take 25 mg by mouth daily.   Yes [provider]  traZODone (DESYREL) 100 MG tablet Take 50-100 mg by mouth at bedtime.   Yes [provider]  umeclidinium-vilanterol (ANORO ELLIPTA) 62.5-25 MCG/INH AEPB Inhale 1 puff into the lungs daily.   Yes [provider]    Allergies as of 12/30/2019  . (No Known Allergies)    Family History  Problem Relation Age of Onset  . Colon cancer Neg Hx     Social History  Socioeconomic History  . Marital status: Married    Spouse name: Not on file  . Number of children: Not on file  . Years of education: Not on file  . Highest education level: Not on file  Occupational History  . Not on file  Tobacco Use  . Smoking status: Current Every Day Smoker    Packs/day: 0.50    Types: Cigarettes  . Smokeless tobacco: Never Used  Vaping Use  . Vaping Use: Never used  Substance and Sexual Activity  . Alcohol use: No    Alcohol/week: 0.0 standard drinks  . Drug use: No  . Sexual activity: Not on file  Other Topics Concern  . Not on file  Social History Narrative  . Not on file   Social Determinants of Health   Financial Resource Strain:   . Difficulty of Paying Living Expenses: Not on file  Food Insecurity:   . Worried About Programme researcher, broadcasting/film/video in the Last Year: Not on file  . Ran Out of Food in the Last Year: Not on file  Transportation Needs:   . Lack of Transportation (Medical): Not on file  . Lack of Transportation (Non-Medical): Not on file  Physical Activity:   . Days of Exercise per Week: Not on file  . Minutes of Exercise per Session: Not on file  Stress:   . Feeling of Stress : Not  on file  Social Connections:   . Frequency of Communication with Friends and Family: Not on file  . Frequency of Social Gatherings with Friends and Family: Not on file  . Attends Religious Services: Not on file  . Active Member of Clubs or Organizations: Not on file  . Attends Banker Meetings: Not on file  . Marital Status: Not on file  Intimate Partner Violence:   . Fear of Current or Ex-Partner: Not on file  . Emotionally Abused: Not on file  . Physically Abused: Not on file  . Sexually Abused: Not on file    Review of Systems: See HPI, otherwise negative ROS  Physical Exam: BP 114/72   Pulse 65   Temp (!) 97.3 F (36.3 C)   Ht 4\' 11"  (1.499 m)   Wt 167 lb 9.6 oz (76 kg)   BMI 33.85 kg/m  General:   Alert,  Well-developed, well-nourished, pleasant and cooperative in NAD Neck:  Supple; no masses or thyromegaly. No significant cervical adenopathy. Lungs:  Clear throughout to auscultation.   No wheezes, crackles, or rhonchi. No acute distress. Heart:  Regular rate and rhythm; no murmurs, clicks, rubs,  or gallops. Abdomen: Mild central obesity.  Positive bowel sounds.  She mildly tender over the extreme left upper quadrant just below the left costal margin.  No appreciable fascial defect or other abnormality.   Pulses:  Normal pulses noted. Extremities:  Without clubbing or edema.  Impression/Plan: 54 year old lady with chronic left upper quadrant abdominal pain.   Symptoms present now for several years..  She has been seen by multiple gastroenterologists. Extensively evaluated.  Her GERD symptoms are well controlled on PPI therapy.  Constipation well managed with Linzess.  At this time, I do not not think her left upper quadrant abdominal pain is emanating from her visceral GI tract.  More likely it is musculoskeletal/neuropathic in origin. Chronic musculoskeletal strain remains a possibility as is neuropathic origin.  She does not recall ever having a rash  or a clinical episode consistent with shingles.  However, zoster sine herpete would remain  a possibility.  She is under a lot of stress with a recent personal loss.  Trazodone recently added to her regimen.   Recommendations:  Her GERD is well controlled on twice daily PPI; I recommend this regimen be continued but at a dose of omeprazole 40 mg once in the morning  Likewise, constipation well managed with Linzess 290 every other day.  She was instructed to continue this regimen.  Optimal glycemic control is very important.  Fully agree with a trial of Trazodone -hopefully, this will help her sleep.  In addition, this agent may blunt some of the afferent nocioreceptive pain pathways to give her some relief from her left upper quadrant abdominal pain.   I explained to the patient it may take several weeks to fully appreciate positive effects of trazodone.  We will plan to see her back in 6 weeks.  Depending on her clinical response, could consider cutaneous Lidocaine and/or formal pain management referral.      Notice: This dictation was prepared with Dragon dictation along with smaller phrase technology. Any transcriptional errors that result from this process are unintentional and may not be corrected upon review.

## 2019-12-30 NOTE — Patient Instructions (Addendum)
Continue Trazodone at bedtime without fail  Decrease the omeprazole to once daily in the morning  Linzess 290 every other day.  Office visit in 6 weeks

## 2020-02-10 ENCOUNTER — Ambulatory Visit: Payer: BC Managed Care – PPO | Admitting: Gastroenterology

## 2020-03-21 ENCOUNTER — Other Ambulatory Visit: Payer: Self-pay

## 2020-03-21 ENCOUNTER — Ambulatory Visit: Payer: BC Managed Care – PPO | Admitting: Gastroenterology

## 2020-03-21 ENCOUNTER — Encounter: Payer: Self-pay | Admitting: Gastroenterology

## 2020-03-21 VITALS — BP 131/73 | HR 66 | Temp 96.8°F | Ht 59.0 in | Wt 157.2 lb

## 2020-03-21 DIAGNOSIS — R1310 Dysphagia, unspecified: Secondary | ICD-10-CM

## 2020-03-21 DIAGNOSIS — K219 Gastro-esophageal reflux disease without esophagitis: Secondary | ICD-10-CM

## 2020-03-21 DIAGNOSIS — R1012 Left upper quadrant pain: Secondary | ICD-10-CM | POA: Insufficient documentation

## 2020-03-21 DIAGNOSIS — K5900 Constipation, unspecified: Secondary | ICD-10-CM

## 2020-03-21 MED ORDER — LIDOCAINE 5 % EX PTCH: 1.0000 | MEDICATED_PATCH | CUTANEOUS | 0 refills | Status: DC

## 2020-03-21 MED ORDER — OMEPRAZOLE 40 MG PO CPDR: 40.0000 mg | DELAYED_RELEASE_CAPSULE | Freq: Two times a day (BID) | ORAL | 1 refills | Status: AC

## 2020-03-21 NOTE — Patient Instructions (Addendum)
1. New prescription for omeprazole sent to your pharmacy.  Take 40 mg 30 minutes before breakfast and second dose either before your evening meal or at bedtime. 2. Lidocaine patches sent to your pharmacy.  Place 1 patch at site of pain daily, remove after 12 hours.  (Leave patch for 12 hours only, reapply new patch the next day). 3. H. pylori stool antigen.  You will need to be off of omeprazole and antibiotics for 2 weeks prior to specimen collection.  While you are off of the omeprazole you can take Rolaids, Tums, Pepcid for heartburn if needed.  Do not take other antacids or Pepto-Bismol. 4. We will touch base with you with results as available.  At some point in the near future, would plan on colonoscopy given recent rectal bleeding and incomplete GI work-up.  We will allow you time to recover from COVID first. 5. Please touch base with your doctor for persistent cough and congestion. 6. If you decide you want to pursue further evaluation of your swallowing concerns, please call and we will schedule a barium swallow x-ray of your esophagus.

## 2020-03-21 NOTE — Progress Notes (Signed)
Primary Care Physician: Rebekah Chesterfield, NP  Primary Gastroenterologist:  Roetta Sessions, MD   Chief Complaint  Patient presents with  . Abdominal Pain    luq    HPI: [name] is a 55 y.o. female here for follow-up.  She was seen last back in October 2021.  She has an 8-year history of left upper quadrant abdominal pain with some perceived bloating, there is a positional component as well.  Symptoms not necessarily related to meals.  Has been seen by Dr. Teena Dunk, our practice, Dr. Randa Evens and Dulce Sellar in [place] the same complaint.  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  EGD/ED completed by Dr. Jena Gauss back in August with evaluation of prepyloric antral nodule which appeared to look different than a pancreatic rest but appeared to be benign.  Appeared to be larger than previously described and larger than lesion photographed in 2013.  Biopsy showed reactive gastropathy with ulceration and reactive atypia, no H. pylori.  Patient also has a history of chronic constipation, GERD.  Last colonoscopy in 2016 by Dr. Teena Dunk, showing diverticulum in the ascending colon, small internal hemorrhoids.  GES in February 2017 with accelerated gastric emptying.  CTA negative for mesenteric ischemia but noted upper abdominal ventral hernia status post incisional hernia repair with mesh.  Most recent CT abdomen pelvis with contrast July 2021 showed fatty liver, gallbladder surgically removed, stable nonobstructing left renal stone, stomach appeared to be normal.  No other abnormalities.  At time of last office visit it was felt that her chronic left upper quadrant pain was likely musculoskeletal/neuropathic in origin and not emanating from the visceral GI tract. Patient had recently been started on trazadone.  Since we last saw her she had COVID.  She is down about 10 pounds.  She  lost her sense of taste and smell.  This is not returned.  Trazadone did not help LUQ pain.  Recently put on trazodone in the last couple of months due to difficulty sleeping.  Left upper quadrant pain worse depending on the way she sits or moves.  Can see her abdomen changing/moving in this region when it hurts. Can be severe and then leaves with soreness. Pain sometimes worse with meals. Yesterday flared, and only ate beenie weanies and ham sandwich. No n/v. When has BM has LUQ pain. Couple of weeks ago had brbpr one time moderate volume. Right now having BM 3 times per day, postprandial stools and abdominal pain. Every morning has some heartburn. Doubled acid reflux medicine but pharmacy does not get enough. Bid helps.   Seems like swallowing worse post esophageal stretching, can only swallow one pill at time instead of 3-4. Hard to swallow some solid foods.     Current Outpatient Medications  Medication Sig Dispense Refill  . ascorbic acid (VITAMIN C) 500 MG tablet Take 500 mg by mouth daily.    Marland Kitchen aspirin EC 81 MG tablet Take 81 mg by mouth daily.    . bumetanide (BUMEX) 1 MG tablet Take 1 mg by mouth daily.    . dapagliflozin propanediol (FARXIGA) 10 MG TABS tablet Take 10 mg by mouth daily.    Marland Kitchen dicyclomine (BENTYL) 20 MG tablet Take 20 mg by mouth in the morning, at noon, and at bedtime.     Marland Kitchen escitalopram (LEXAPRO) 10 MG tablet Take 20 mg by  mouth daily.    Marland Kitchen gabapentin (NEURONTIN) 300 MG capsule Take 300 mg by mouth 2 (two) times daily.    Marland Kitchen glipiZIDE (GLUCOTROL XL) 10 MG 24 hr tablet Take 10 mg by mouth daily.    Marland Kitchen icosapent Ethyl (VASCEPA) 1 g capsule Take 2 g by mouth 2 (two) times daily.    . insulin degludec (TRESIBA) 100 UNIT/ML FlexTouch Pen Inject 40 Units into the skin daily.    Marland Kitchen levothyroxine (SYNTHROID) 75 MCG tablet Take 75 mcg by mouth daily before breakfast.    . linaclotide (LINZESS) 290 MCG CAPS capsule Take 290 mcg by mouth as needed.    Marland Kitchen losartan-hydrochlorothiazide  (HYZAAR) 50-12.5 MG tablet Take 1 tablet by mouth daily.    . metFORMIN (GLUCOPHAGE) 1000 MG tablet Take 1,000 mg by mouth 2 (two) times daily with a meal.     . nitroGLYCERIN (NITROSTAT) 0.4 MG SL tablet Place 0.4 mg under the tongue every 5 (five) minutes as needed for chest pain.     Marland Kitchen omeprazole (PRILOSEC) 40 MG capsule Take 40 mg by mouth in the morning and at bedtime.     Marland Kitchen oxyCODONE (ROXICODONE) 15 MG immediate release tablet Take 15 mg by mouth 4 (four) times daily as needed.    . traZODone (DESYREL) 100 MG tablet Take 100 mg by mouth at bedtime.    Marland Kitchen umeclidinium-vilanterol (ANORO ELLIPTA) 62.5-25 MCG/INH AEPB Inhale 1 puff into the lungs as needed.    . vitamin B-12 (CYANOCOBALAMIN) 1000 MCG tablet Take 1,000 mcg by mouth daily.     No current facility-administered medications for this visit.    Allergies as of 03/21/2020  . (No Known Allergies)   Past Medical History:  Diagnosis Date  . Diabetes mellitus   . GERD (gastroesophageal reflux disease)   . H/O hiatal hernia   . High cholesterol   . History of kidney stones   . Hypertension   . Hypothyroidism   . MI (myocardial infarction) (HCC) 2015  . Neuromuscular disorder (HCC)    neuropathy; carpal tunnel BOTH WRISTS   Past Surgical History:  Procedure Laterality Date  . BIOPSY  10/10/2019   Procedure: BIOPSY;  Surgeon: Corbin Ade, MD;  Location: [facility];  Service: Endoscopy;;  . CESAREAN SECTION     X 3  . CHOLECYSTECTOMY    . COLONOSCOPY  April 2016   Dr. Teena Dunk: diverticulum in ascending colon, small internal hemorrhoids,   . COLONOSCOPY  2013   Dr. Randa Evens: normal mucosa, internal hemorrhoids. path: benign colonic mucosa  . COLONOSCOPY WITH ESOPHAGOGASTRODUODENOSCOPY (EGD)     Dr. Randa Evens.   . CORONARY ANGIOPLASTY WITH STENT PLACEMENT  2015   STENT X 1  . ESOPHAGOGASTRODUODENOSCOPY N/A 04/11/2015   Dr. Outlaw:Gastric nodule   . ESOPHAGOGASTRODUODENOSCOPY  2013   Dr. Randa Evens: normal esophagus.  medium-sized submucosal mass with no bleeding and no stigmata of recent bleeding, s/p biopsy. path: benign small bowel mucosa, surgical polyp with polypoid reactive-type gastropathy, negative h.pylori   . ESOPHAGOGASTRODUODENOSCOPY (EGD) WITH PROPOFOL N/A 10/10/2019   Rourk: Prepyloric antral nodule, appear to be larger than previously described and reports and larger than lesion photographed in 2013.  Biopsy showed reactive gastropathy with ulceration and reactive atypia, no H. pylori.  Esophageal dilation performed due to history of dysphagia.  . EUS  11/18/2011   Dr. Dulce Sellar: gastric nodule, suspect benign leiomyoma or focal thicekning of muscularis propria with extension to the pyloric channel. Consider repeat EUS in 1-2 years.   Marland Kitchen  EUS N/A 04/11/2015   Dr. Dulce Sellar: 10X62mm nodule, no significant change in size since last EUS Sept 2013. no further surveillance needed   . INCISIONAL HERNIA REPAIR N/A 06/08/2015   Procedure: Sherald Hess HERNIORRHAPHY WITH MESH;  Surgeon: Franky Macho, MD;  Location: [facility];  Service: General;  Laterality: N/A;  . INSERTION OF MESH N/A 06/08/2015   Procedure: INSERTION OF MESH;  Surgeon: Franky Macho, MD;  Location: [facility];  Service: General;  Laterality: N/A;  Elease Hashimoto DILATION N/A 10/10/2019   Procedure: Elease Hashimoto DILATION;  Surgeon: Corbin Ade, MD;  Location: [facility];  Service: Endoscopy;  Laterality: N/A;  . UTERINE ABLATION     Family History  Problem Relation Age of Onset  . Colon cancer Neg Hx    Social History   Tobacco Use  . Smoking status: Current Every Day Smoker    Packs/day: 0.50    Types: Cigarettes  . Smokeless tobacco: Never Used  Vaping Use  . Vaping Use: Never used  Substance Use Topics  . Alcohol use: No    Alcohol/week: 0.0 standard drinks  . Drug use: No     ROS:  General: Negative for anorexia,  fever, chills, positive fatigue, weakness.  See HPI ENT: Negative for hoarseness.  Positive difficulty swallowing , nasal  congestion. CV: Negative for chest pain, angina, palpitations, dyspnea on exertion, peripheral edema.  Respiratory: Negative for dyspnea at rest, dyspnea on exertion,  wheezing.  Positive for cough and congestion GI: See history of present illness. GU:  Negative for dysuria, hematuria, urinary incontinence, urinary frequency, nocturnal urination.  Endo: Negative for unusual weight change.    Physical Examination:   BP 131/73   Pulse 66   Temp (!) 96.8 F (36 C) (Temporal)   Ht 4\' 11"  (1.499 m)   Wt 157 lb 3.2 oz (71.3 kg)   BMI 31.75 kg/m   General: Well-nourished, well-developed in no acute distress.  Eyes: No icterus. Mouth: masked Lungs: Scattered wheezing more on the left side than right Heart: Regular rate and rhythm, no murmurs rubs or gallops.  Abdomen: Bowel sounds are normal,nondistended, no hepatosplenomegaly or masses, no abdominal bruits or hernia , no rebound or guarding.  Mild to moderate epigastric/left upper quadrant tenderness Extremities: No lower extremity edema. No clubbing or deformities. Neuro: Alert and oriented x 4   Skin: Warm and dry, no jaundice.   Psych: Alert and cooperative, normal mood and affect.  Labs:  Lab Results  Component Value Date   CREATININE 0.70 10/07/2019   BUN 17 10/07/2019   NA 138 10/07/2019   K 3.5 10/07/2019   CL 99 10/07/2019   CO2 24 10/07/2019   Lab Results  Component Value Date   ALT 36 09/09/2019   AST 67 (H) 09/09/2019   ALKPHOS 57 09/09/2019   BILITOT 0.4 09/09/2019   Lab Results  Component Value Date   WBC 9.1 09/09/2019   HGB 14.4 09/09/2019   HCT 42.8 09/09/2019   MCV 95.1 09/09/2019   PLT 241 09/09/2019   Lab Results  Component Value Date   LIPASE 21 09/09/2019     Imaging Studies: No results found.  Impression/plan:  55 year old female history of chronic left upper quadrant pain for years, chronic constipation, GERD, dysphagia, and prepyloric antral nodule presenting for follow-up.  Just  getting over COVID.40  Chronic left upper quadrant pain: Extensive evaluation as outlined above.  Prepyloric antral nodule not contributing to her pain.  Previously felt to have a significant  musculoskeletal component.  Today she describes a postprandial component and also has increased pain associated with BM.  Suspect multifactorial.  She inquires about being tested for H. pylori.  Previous prepyloric antral nodule biopsy negative for H. pylori, will check H. pylori stool antigen for completeness.  Plan for colonoscopy at a later date given recent rectal bleeding, see below.  Will try lidocaine patch.  She will continue trazodone.  Ultimately she may need an additional GI opinion from tertiary care center versus pain management referral.  Chronic constipation: Well-controlled at this time.  Uses Linzess as needed only.  Has not required lately.  Chronic GERD: Previously better controlled on omeprazole 40 mg twice daily.  Has had difficulty obtaining prescription for twice daily dosing and has been utilizing over-the-counter PPI in addition to her omeprazole 40 mg daily.  New prescription provided.  We will see if her dysphagia improves with change in therapy.  We discussed possibility of barium pill esophagram to further evaluate ongoing swallowing concerns but she does not want to pursue at this time.  She will call if she changes her mind.  COVID: Tested positive over Christmas.  Continues to have persistent cough and congestion.  Encouraged her to follow-up with her PCP.  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  No indication for ongoing surveillance.  Fatty liver: Noted on prior imaging, most recent CT abdomen and pelvis with contrast.  AST has previously been elevated.  We will follow-up at a later date given recent COVID infection which may alter her labs.  Rectal bleeding: Last colonoscopy in 2016.  Consider updating colonoscopy in the near future once she is fully recovered from  COVID.  Patient agreeable.  Plan: 1. Omeprazole 40 mg twice daily before meals. 2. Lidocaine patch apply 1 patch for 12 hours each day. 3. H. pylori stool antigen.  She will need to be off omeprazole and any antibiotics for 2 weeks prior to collection. 4. Plan on colonoscopy in the near future to further evaluate rectal bleeding, once fully recovered from COVID. 5. LFTs at later date for fatty liver and elevated AST. 6. Encourage patient to follow-up with PCP for persistent cough and congestion. 7. If she desires further evaluation of swallowing concerns, would consider barium pill esophagram as next step.  Patient will let us know when she is ready.

## 2020-07-15 IMAGING — DX RIGHT FOOT COMPLETE - 3+ VIEW
3 series · 3 of 3 positions shown · non-contrast
Comparison: None.

CLINICAL DATA: Dropped heavy object on foot a few weeks ago.
Persistent pain and swelling.

EXAM:
RIGHT FOOT COMPLETE - 3+ VIEW

[foot ap]
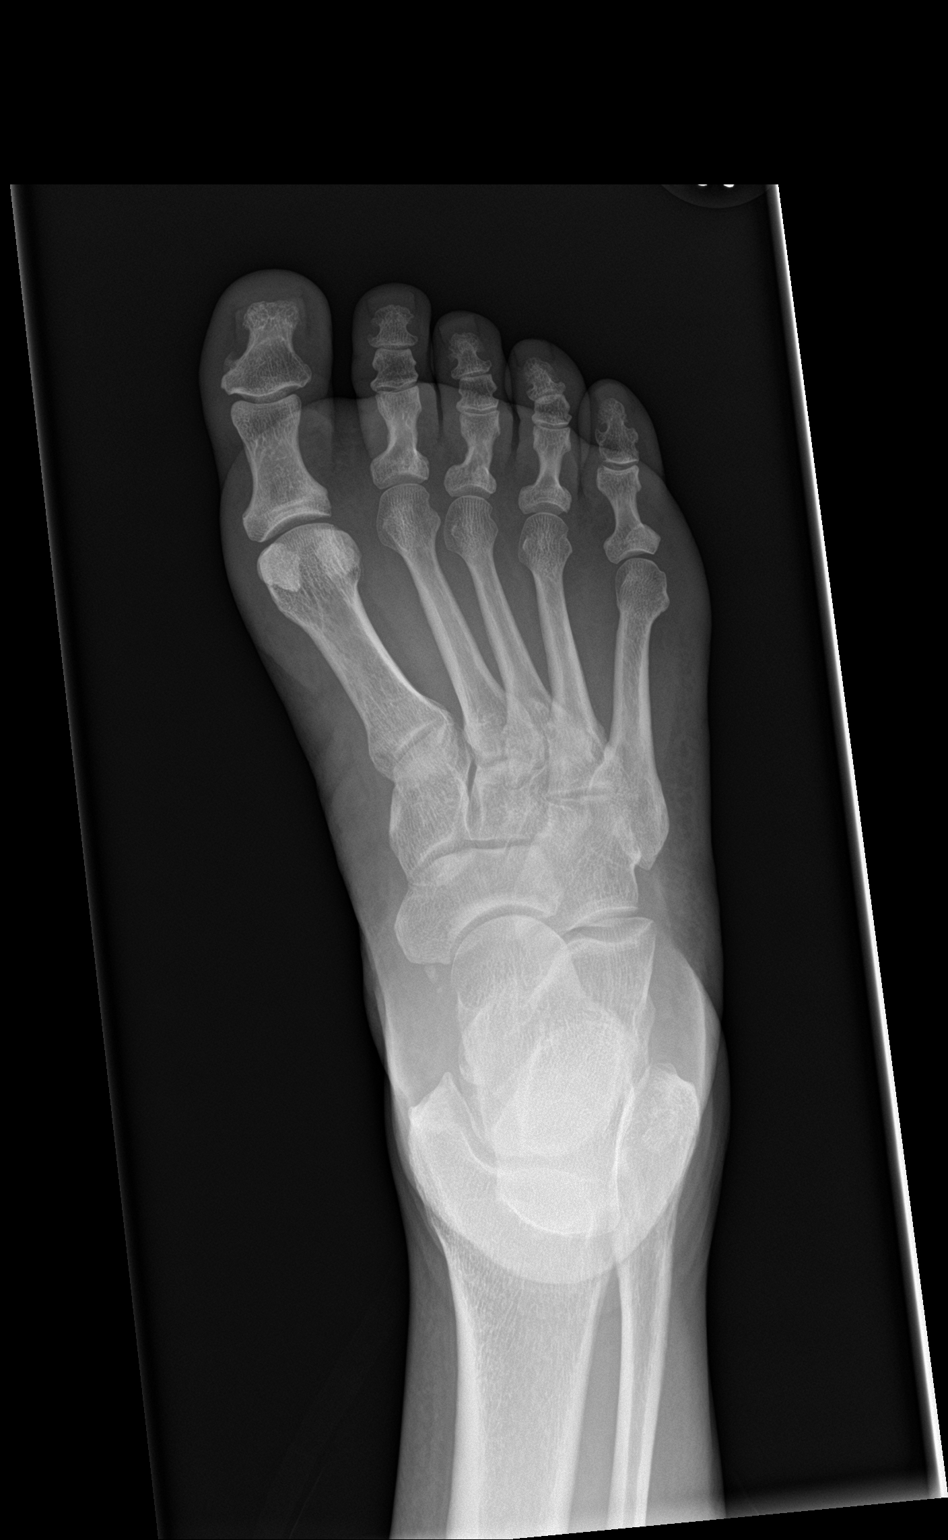

[foot obl]
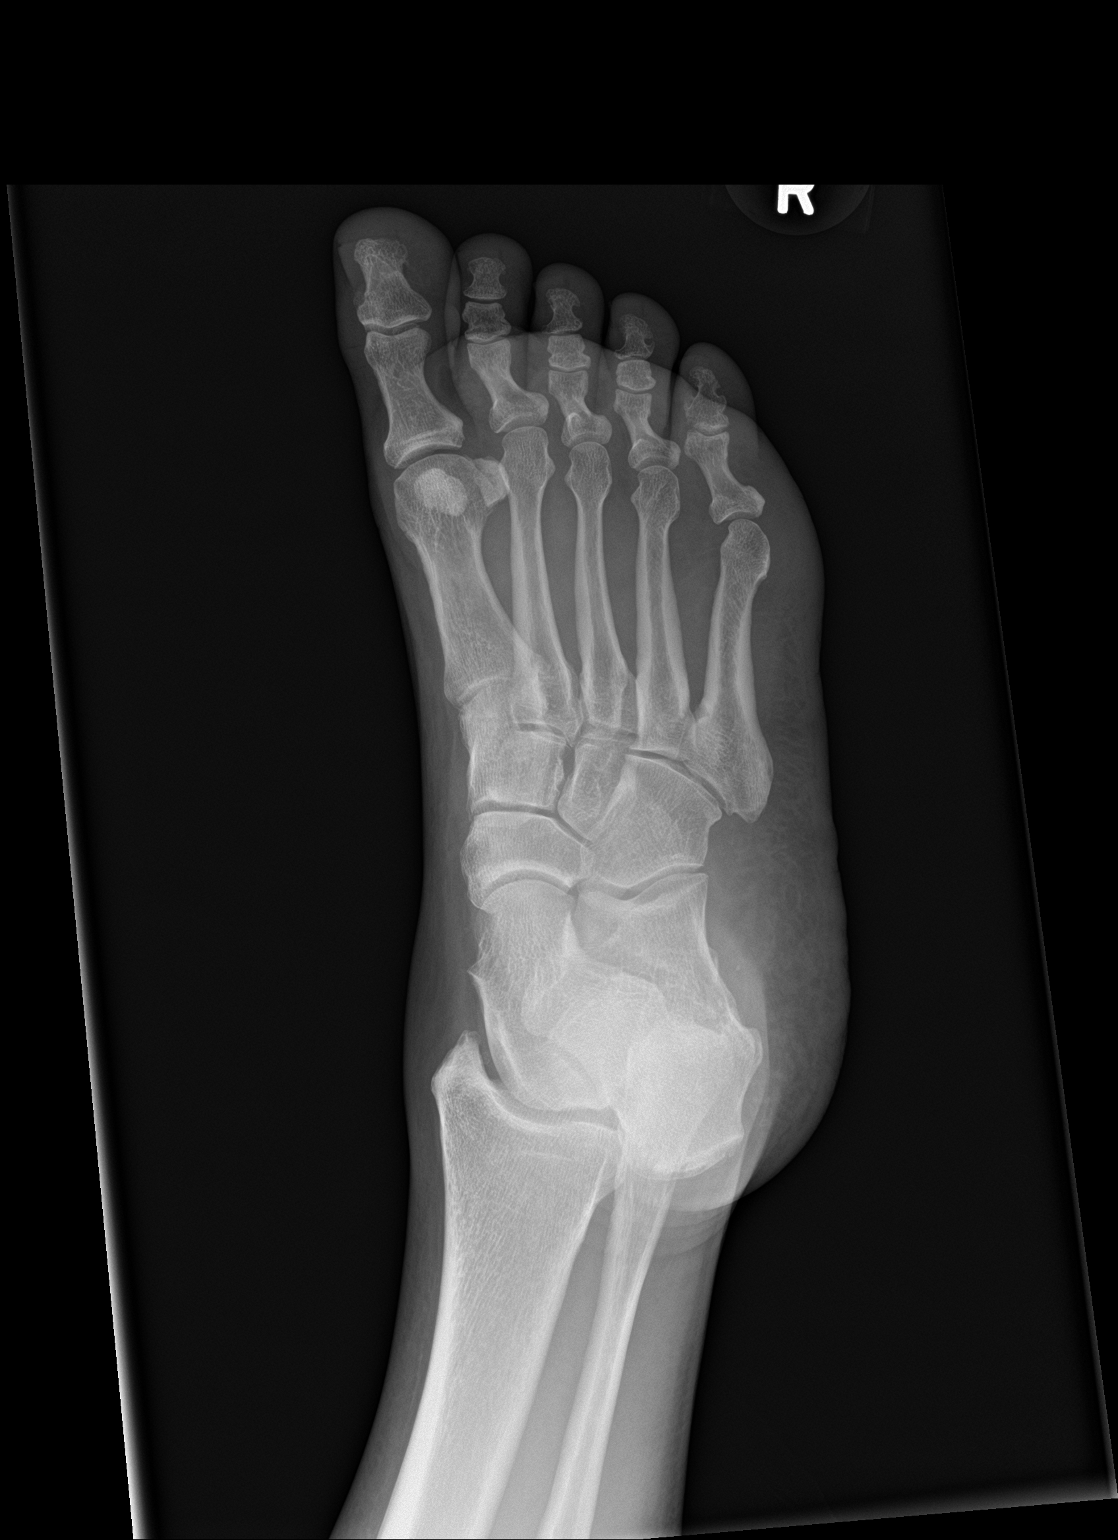

[foot lat]
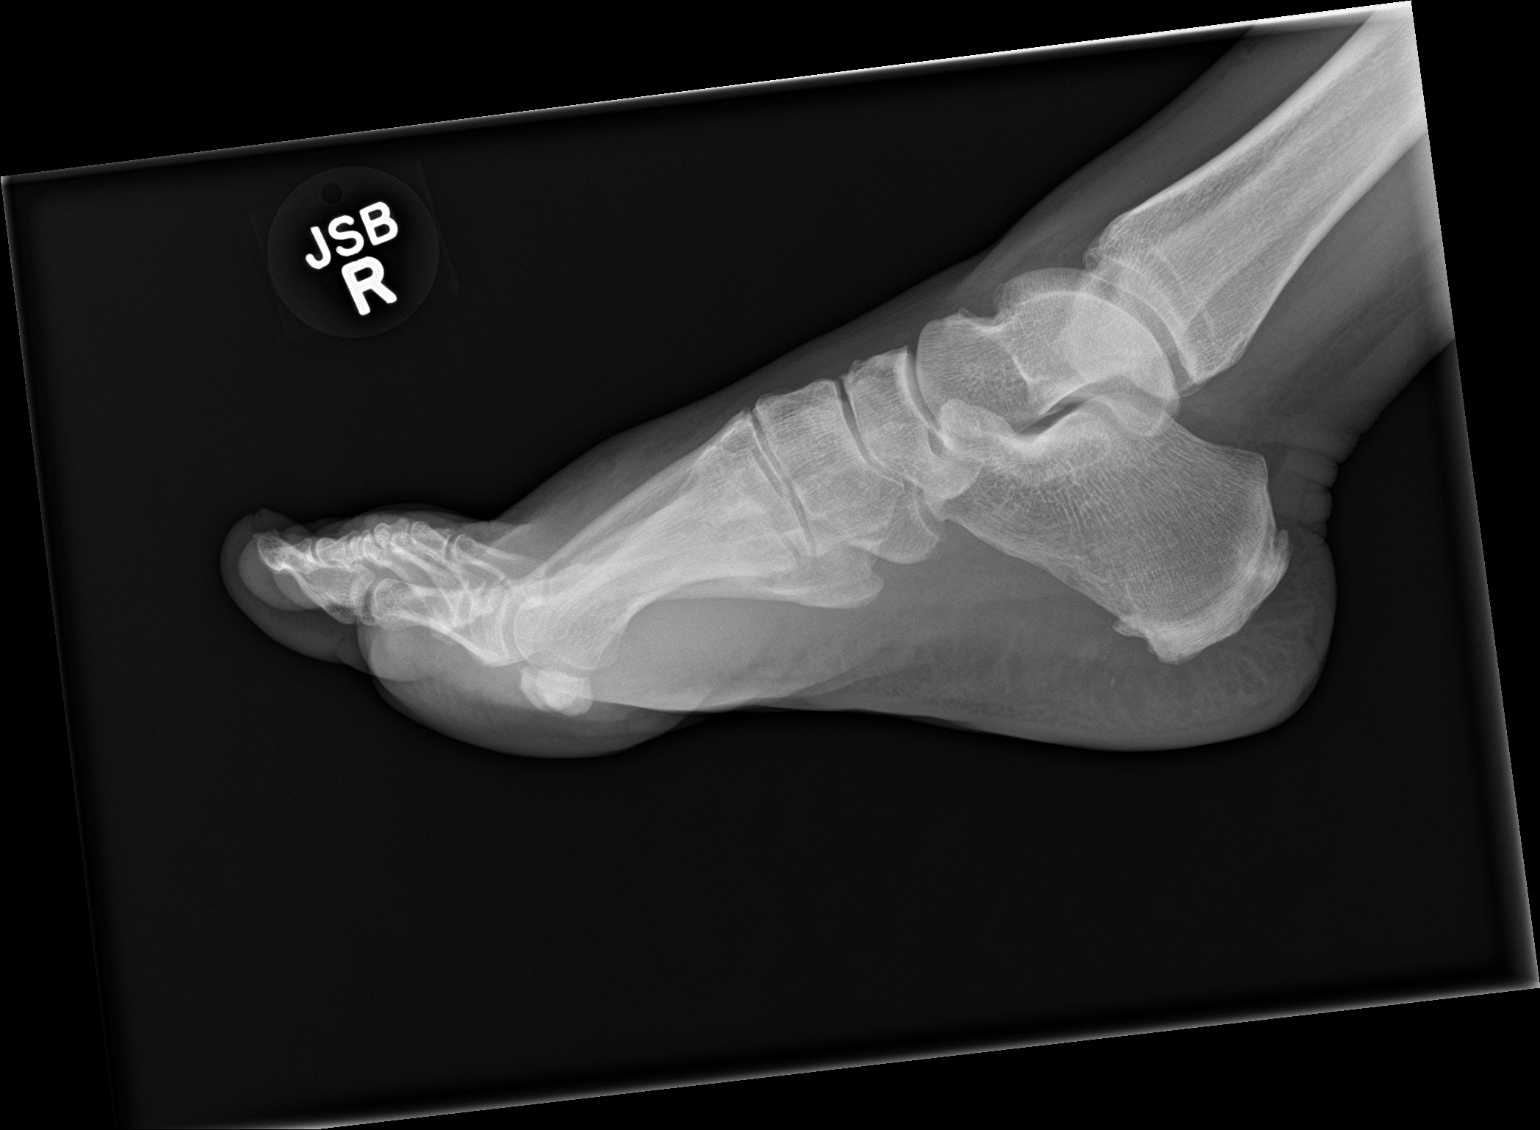

[3 of 3 positions shown; findings below may reference images not displayed]

FINDINGS: The joint spaces are maintained. No acute fracture is identified. No
radiopaque foreign body. Calcaneal spurring changes are noted. Mild
Popo Mueller.
IMPRESSION: No acute bony findings or significant degenerative changes.

## 2020-07-15 IMAGING — DX CHEST - 2 VIEW
2 series · 2 of 2 positions shown · non-contrast
Comparison: Chest CT 05/12/2018.  Chest x-ray 05/12/2018.

CLINICAL DATA: Chest pain

EXAM:
CHEST - 2 VIEW

[chest pa]
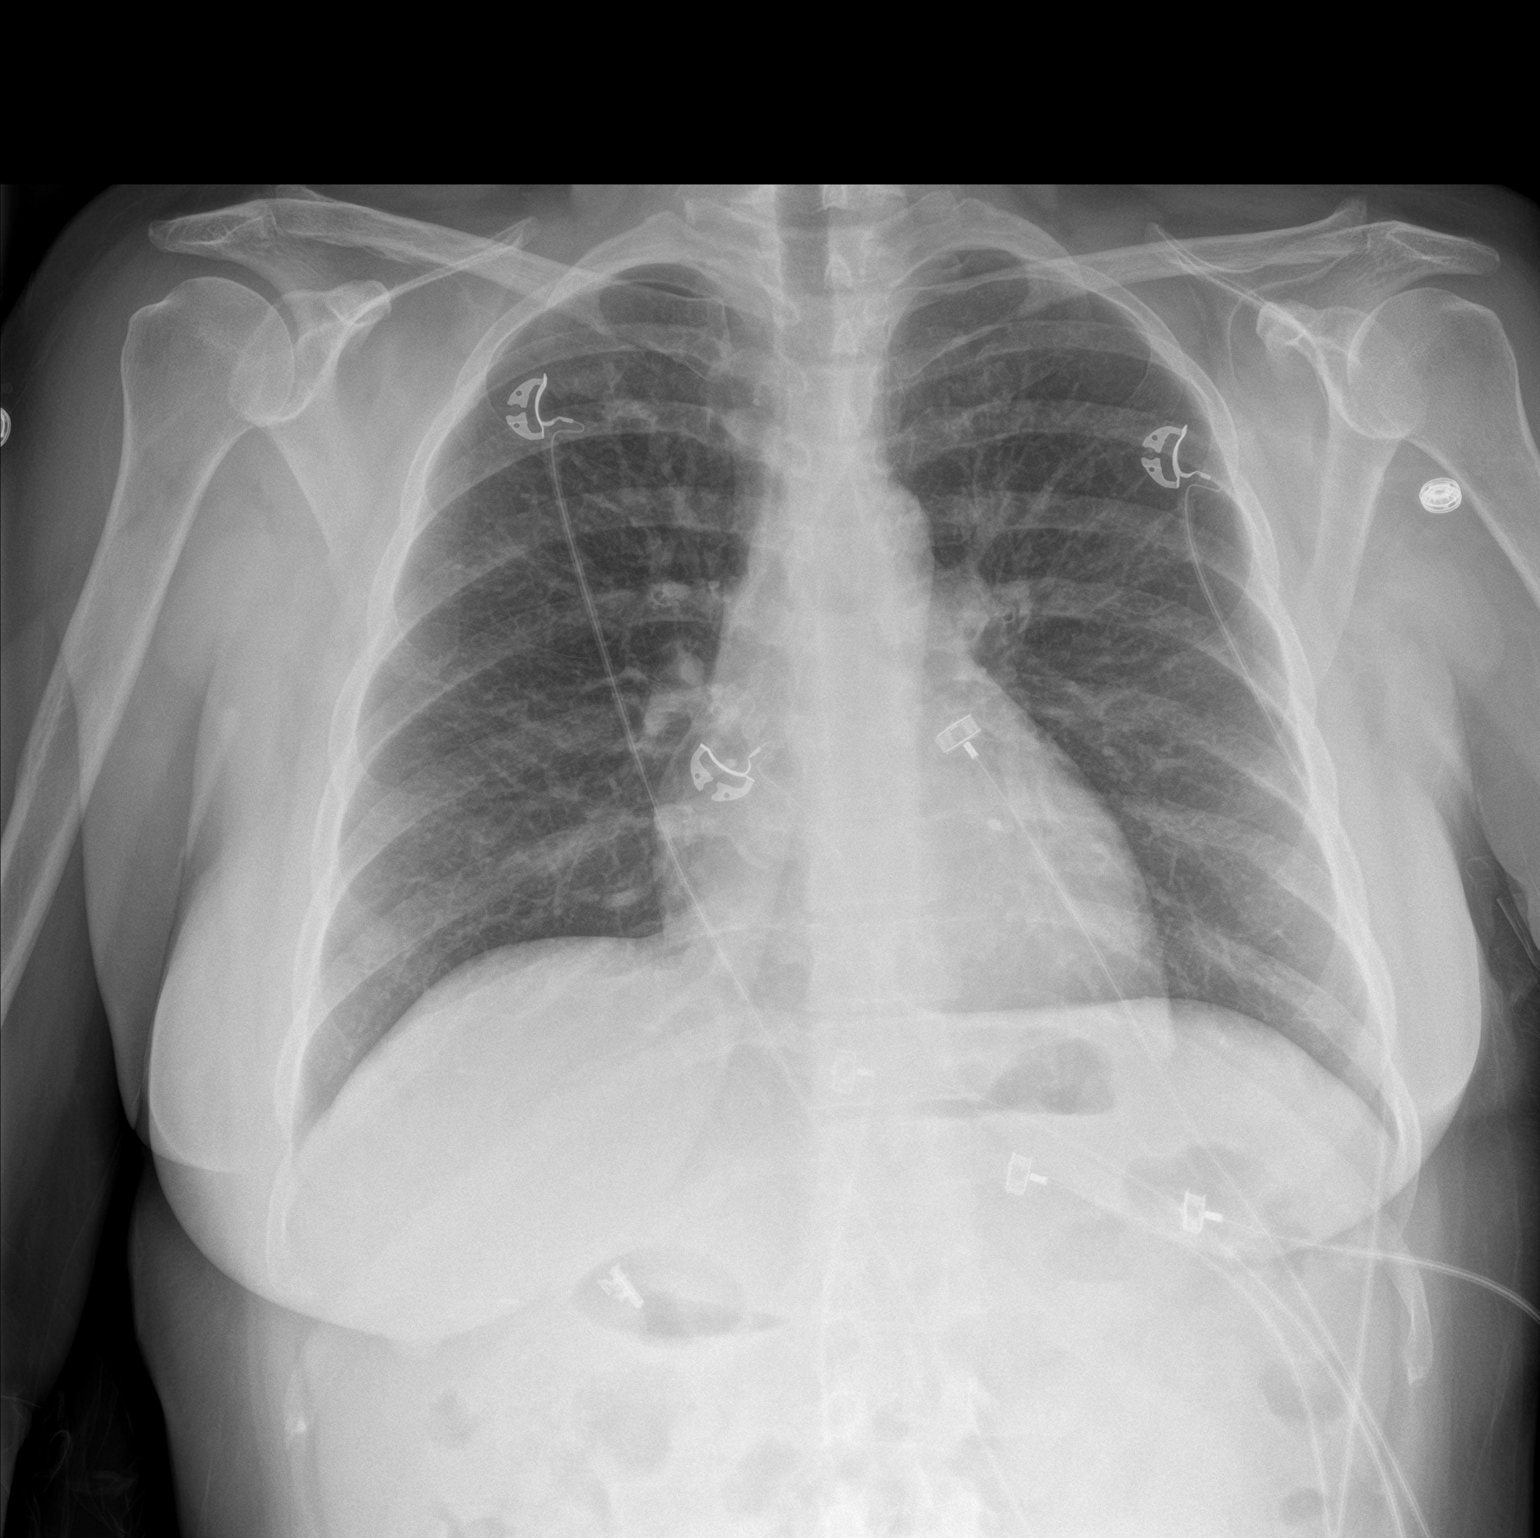

[chest lat]
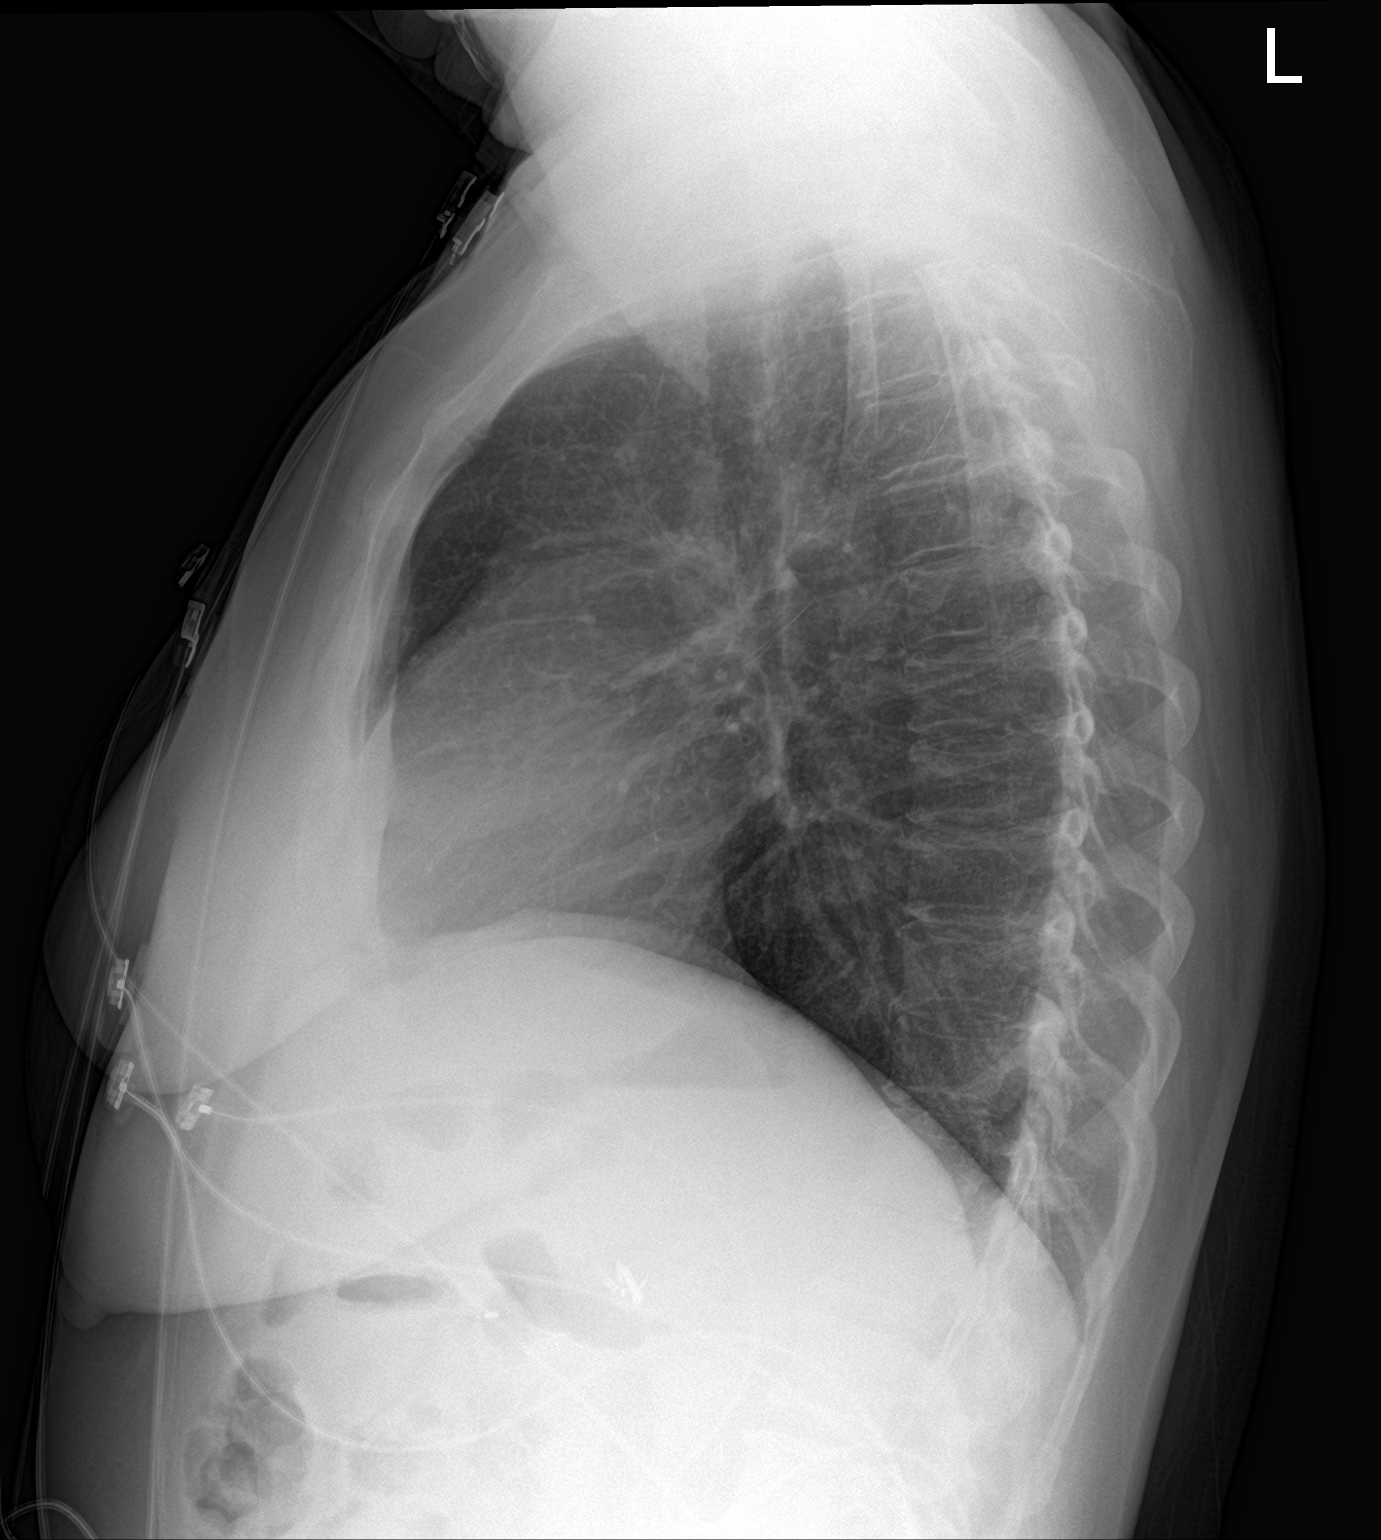

[2 of 2 positions shown; findings below may reference images not displayed]

FINDINGS: The lungs are clear without focal pneumonia, edema, pneumothorax or
pleural effusion. Interstitial markings are diffusely coarsened with
chronic features. The cardiopericardial silhouette is within normal
limits for size. The visualized bony structures of the thorax are
intact. Telemetry leads overlie the chest.
IMPRESSION: No active cardiopulmonary disease.

## 2021-01-19 ENCOUNTER — Other Ambulatory Visit: Payer: Self-pay | Admitting: Gastroenterology

## 2021-05-07 ENCOUNTER — Encounter (HOSPITAL_COMMUNITY): Payer: Self-pay | Admitting: Emergency Medicine

## 2021-05-07 ENCOUNTER — Emergency Department (HOSPITAL_COMMUNITY): Payer: BC Managed Care – PPO

## 2021-05-07 ENCOUNTER — Emergency Department (HOSPITAL_COMMUNITY)
Admission: EM | Admit: 2021-05-07 | Discharge: 2021-05-07 | Disposition: A | Discharge status: Home / Self Care | Payer: BC Managed Care – PPO | Attending: Emergency Medicine | Admitting: Emergency Medicine

## 2021-05-07 ENCOUNTER — Other Ambulatory Visit: Payer: Self-pay

## 2021-05-07 DIAGNOSIS — Z794 Long term (current) use of insulin: Secondary | ICD-10-CM | POA: Insufficient documentation

## 2021-05-07 DIAGNOSIS — R079 Chest pain, unspecified: Secondary | ICD-10-CM

## 2021-05-07 DIAGNOSIS — Z7984 Long term (current) use of oral hypoglycemic drugs: Secondary | ICD-10-CM | POA: Diagnosis not present

## 2021-05-07 DIAGNOSIS — I2510 Atherosclerotic heart disease of native coronary artery without angina pectoris: Secondary | ICD-10-CM | POA: Diagnosis not present

## 2021-05-07 DIAGNOSIS — M79602 Pain in left arm: Secondary | ICD-10-CM | POA: Insufficient documentation

## 2021-05-07 DIAGNOSIS — Z79899 Other long term (current) drug therapy: Secondary | ICD-10-CM | POA: Insufficient documentation

## 2021-05-07 DIAGNOSIS — R001 Bradycardia, unspecified: Secondary | ICD-10-CM | POA: Diagnosis not present

## 2021-05-07 DIAGNOSIS — Z7982 Long term (current) use of aspirin: Secondary | ICD-10-CM | POA: Diagnosis not present

## 2021-05-07 LAB — CBC
HCT: 37.9 % (ref 36.0–46.0)
Hemoglobin: 12.1 g/dL (ref 12.0–15.0)
MCH: 31.8 pg (ref 26.0–34.0)
MCHC: 31.9 g/dL (ref 30.0–36.0)
MCV: 99.5 fL (ref 80.0–100.0)
Platelets: 217 10*3/uL (ref 150–400)
RBC: 3.81 MIL/uL — ABNORMAL LOW (ref 3.87–5.11)
RDW: 13.8 % (ref 11.5–15.5)
WBC: 9.2 10*3/uL (ref 4.0–10.5)
nRBC: 0 % (ref 0.0–0.2)

## 2021-05-07 LAB — TROPONIN I (HIGH SENSITIVITY)
Troponin I (High Sensitivity): 4 ng/L (ref <18)
Troponin I (High Sensitivity): 5 ng/L (ref <18)

## 2021-05-07 LAB — BASIC METABOLIC PANEL
Anion gap: 9 (ref 5–15)
BUN: 19 mg/dL (ref 6–20)
CO2: 28 mmol/L (ref 22–32)
Calcium: 8.5 mg/dL — ABNORMAL LOW (ref 8.9–10.3)
Chloride: 101 mmol/L (ref 98–111)
Creatinine, Ser: 1.4 mg/dL — ABNORMAL HIGH (ref 0.44–1.00)
GFR, Estimated: 44 mL/min — ABNORMAL LOW (ref >60)
Glucose, Bld: 127 mg/dL — ABNORMAL HIGH (ref 70–99)
Potassium: 3.5 mmol/L (ref 3.5–5.1)
Sodium: 138 mmol/L (ref 135–145)

## 2021-05-07 NOTE — ED Notes (Signed)
Discharge instructions reviewed with patient. Patient verbalized understanding of instructions. Follow-up care and medications were reviewed. Patient ambulatory with steady gait. VSS upon discharge.  ?

## 2021-05-07 NOTE — ED Provider Triage Note (Signed)
Emergency Medicine Provider Triage Evaluation Note  [name] , a 56 y.o. female  was evaluated in triage.  Pt complains of left arm pain that radiates into the neck.  She states this feels similar to when she had her heart attack.  She has a stent in the circumflex.  Not on any anticoagulation.  Does have history of hypertension, hyperlipidemia, and diabetes.  Patient does smoke half pack cigarettes a day.  Review of Systems  Positive:  Negative: See above   Physical Exam  BP (!) 105/58 (BP Location: Right Arm)    Pulse (!) 53    Temp 98.1 F (36.7 C) (Oral)    Resp 14    SpO2 95%  Gen:   Awake, no distress   Resp:  Normal effort  MSK:   Moves extremities without difficulty  Other:   Medical Decision Making  Medically screening exam initiated at 3:17 PM.  Appropriate orders placed.  [name] was informed that the remainder of the evaluation will be completed by another provider, this initial triage assessment does not replace that evaluation, and the importance of remaining in the ED until their evaluation is complete.     [name], [place] 05/07/21 1517

## 2021-05-07 NOTE — Discharge Instructions (Signed)
Please follow-up with your cardiologist regarding your pain today.  Additionally recommend following up with your primary care doctor.  If you develop recurrent episodes of chest pain, difficulty breathing or other new concerning symptom, please return to ER for reassessment.

## 2021-05-07 NOTE — ED Triage Notes (Signed)
Patient of midsternal chest pain that started yesterday and radiates into her left jaw. Patient went to sleep hoping pain would resolve but instead it got worse today. History of MI in 2015, states pain today feels similar to then. 324mg  ASA and 1x SL NTG, 4mg  zofran, 545mL NS given PTA with EMS. 18g saline lock in left AC.  EMS vitals 143/89

## 2021-05-07 NOTE — ED Provider Notes (Signed)
[place] EMERGENCY DEPARTMENT Provider Note   CSN: [id number] Arrival date & time: 05/07/21  1439     History  Chief Complaint  Patient presents with   Chest Pain    KINLEA GEHO is a 56 y.o. female.  Presented to the emergency room with concern for chest pain.  Patient reports that over the past couple days she has noted pain in her left arm.  Seems to be worse with certain movements, no obvious alleviating factors.  Today she had sensation of mild chest discomfort, lasted for a few minutes.  Resolved prior to arrival.  Currently does not have any chest discomfort.  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  HPI     Home Medications Prior to Admission medications   Medication Sig Start Date End Date Taking? Authorizing Provider  ascorbic acid (VITAMIN C) 500 MG tablet Take 500 mg by mouth daily.    [provider]  aspirin EC 81 MG tablet Take 81 mg by mouth daily.    [provider]  bumetanide (BUMEX) 1 MG tablet Take 1 mg by mouth daily.    [provider]  dapagliflozin propanediol (FARXIGA) 10 MG TABS tablet Take 10 mg by mouth daily.    [provider]  dicyclomine (BENTYL) 20 MG tablet Take 20 mg by mouth in the morning, at noon, and at bedtime.     [provider]  escitalopram (LEXAPRO) 10 MG tablet Take 20 mg by mouth daily. 09/03/19   [provider]  gabapentin (NEURONTIN) 300 MG capsule Take 300 mg by mouth 2 (two) times daily.    [provider]  glipiZIDE (GLUCOTROL XL) 10 MG 24 hr tablet Take 10 mg by mouth daily. 09/03/19   [provider]  icosapent Ethyl (VASCEPA) 1 g capsule Take 2 g by mouth 2 (two) times daily.    [provider]  insulin degludec (TRESIBA) 100 UNIT/ML FlexTouch Pen Inject 40 Units into the skin daily.    [provider]  levothyroxine (SYNTHROID) 75 MCG tablet Take 75 mcg by mouth daily before  breakfast.    [provider]  lidocaine (LIDODERM) 5 % PLACE 1 PATCH ONTO THE SKIN DAILY. REMOVE & DISCARD [facility] WITHIN 12 HOURS 01/22/21   Mahala Menghini, PA-C  linaclotide [facility]) 290 MCG CAPS capsule Take 290 mcg by mouth as needed.    [provider]  losartan-hydrochlorothiazide (HYZAAR) 50-12.5 MG tablet Take 1 tablet by mouth daily.    [provider]  metFORMIN (GLUCOPHAGE) 1000 MG tablet Take 1,000 mg by mouth 2 (two) times daily with a meal.     [provider]  nitroGLYCERIN (NITROSTAT) 0.4 MG SL tablet Place 0.4 mg under the tongue every 5 (five) minutes as needed for chest pain.  12/25/13   [provider]  omeprazole (PRILOSEC) 40 MG capsule Take 1 capsule (40 mg total) by mouth 2 (two) times daily with a meal. 03/21/20   Mahala Menghini, PA-C  oxyCODONE (ROXICODONE) 15 MG immediate release tablet Take 15 mg by mouth 4 (four) times daily as needed.    [provider]  traZODone (DESYREL) 100 MG tablet Take 100 mg by mouth at bedtime.    [provider]  umeclidinium-vilanterol (ANORO ELLIPTA) 62.5-25 MCG/INH AEPB Inhale 1 puff into the lungs as needed.    [provider]  vitamin B-12 (CYANOCOBALAMIN) 1000 MCG tablet Take 1,000 mcg by  mouth daily.    [provider]      Allergies    Patient has no known allergies.    Review of Systems   Review of Systems  Constitutional:  Negative for chills and fever.  HENT:  Negative for ear pain and sore throat.   Eyes:  Negative for pain and visual disturbance.  Respiratory:  Negative for cough and shortness of breath.   Cardiovascular:  Positive for chest pain. Negative for palpitations.  Gastrointestinal:  Negative for abdominal pain and vomiting.  Genitourinary:  Negative for dysuria and hematuria.  Musculoskeletal:  Positive for arthralgias. Negative for back pain.  Skin:  Negative for color change and rash.  Neurological:  Negative for seizures and  syncope.  All other systems reviewed and are negative.  Physical Exam Updated Vital Signs BP (!) 107/41    Pulse (!) 52    Temp 98.1 F (36.7 C) (Oral)    Resp 16    Ht 4\' 11"  (1.499 m)    Wt 71.3 kg    SpO2 96%    BMI 31.75 kg/m  Physical Exam Vitals and nursing note reviewed.  Constitutional:      General: She is not in acute distress.    Appearance: She is well-developed.  HENT:     Head: Normocephalic and atraumatic.  Eyes:     Conjunctiva/sclera: Conjunctivae normal.  Cardiovascular:     Rate and Rhythm: Regular rhythm. Bradycardia present.     Heart sounds: No murmur heard. Pulmonary:     Effort: Pulmonary effort is normal. No respiratory distress.     Breath sounds: Normal breath sounds.  Abdominal:     Palpations: Abdomen is soft.     Tenderness: There is no abdominal tenderness.  Musculoskeletal:        General: No swelling.     Cervical back: Neck supple.     Comments: Some tenderness to palpation in the left upper arm, no deformity noted, no significant edema, normal radial pulses bilaterally  Skin:    General: Skin is warm and dry.     Capillary Refill: Capillary refill takes less than 2 seconds.  Neurological:     Mental Status: She is alert.  Psychiatric:        Mood and Affect: Mood normal.    ED Results / Procedures / Treatments   Labs (all labs ordered are listed, but only abnormal results are displayed) Labs Reviewed  BASIC METABOLIC PANEL - Abnormal; Notable for the following components:      Result Value   Glucose, Bld 127 (*)    Creatinine, Ser 1.40 (*)    Calcium 8.5 (*)    GFR, Estimated 44 (*)    All other components within normal limits  CBC - Abnormal; Notable for the following components:   RBC 3.81 (*)    All other components within normal limits  TROPONIN I (HIGH SENSITIVITY)  TROPONIN I (HIGH SENSITIVITY)    EKG EKG Interpretation  Date/Time:  Tuesday May 07 2021 14:48:22 EST Ventricular Rate:  52 PR Interval:  152 QRS  Duration: 88 QT Interval:  476 QTC Calculation: 442 R Axis:   28 Text Interpretation: Sinus bradycardia Low voltage QRS Borderline ECG When compared with ECG of 09-Sep-2019 12:33, No significant change since last tracing Confirmed by Madalyn Rob [phone]) on 05/07/2021 4:53:55 PM  Radiology DG Chest 2 View  Result Date: 05/07/2021 CLINICAL DATA:  Midsternal chest pain radiating to left side jaw, history of coronary artery  disease EXAM: CHEST - 2 VIEW COMPARISON:  04/04/2021 FINDINGS: The heart size and mediastinal contours are within normal limits. Both lungs are clear. The visualized skeletal structures are unremarkable. IMPRESSION: No active cardiopulmonary disease. Electronically Signed   By: Randa Ngo M.D.   On: 05/07/2021 15:53   VAS [place] UPPER EXTREMITY VENOUS DUPLEX  Result Date: 05/07/2021 UPPER VENOUS STUDY  Patient Name:  LOICE GODBEE  Date of Exam:   05/07/2021 Medical Rec #: [id number]       Accession #:    [id number] Date of Birth: Sep 07, 1965       Patient Gender: F Patient Age:   17 years Exam Location:  [facility] Procedure:      VAS [place] UPPER EXTREMITY VENOUS DUPLEX Referring Phys: Madalyn Rob --------------------------------------------------------------------------------  Indications: Pain Comparison Study: No previous exam Performing Technologist: Jody Hill RVT, RDMS  Examination Guidelines: A complete evaluation includes B-mode imaging, spectral Doppler, color Doppler, and power Doppler as needed of all accessible portions of each vessel. Bilateral testing is considered an integral part of a complete examination. Limited examinations for reoccurring indications may be performed as noted.  Right Findings: +-----+------------+---------+-----------+----------+-----------------------+  RIGHT Compressible Phasicity Spontaneous Properties         Summary          +-----+------------+---------+-----------+----------+-----------------------+  IJV                   Yes         Yes                patent by color/doppler  +-----+------------+---------+-----------+----------+-----------------------+  Left Findings: +----------+------------+---------+-----------+----------+-------+  LEFT       Compressible Phasicity Spontaneous Properties Summary  +----------+------------+---------+-----------+----------+-------+  IJV            Full        Yes        Yes                         +----------+------------+---------+-----------+----------+-------+  Subclavian     Full        Yes        Yes                         +----------+------------+---------+-----------+----------+-------+  Axillary       Full        Yes        Yes                         +----------+------------+---------+-----------+----------+-------+  Brachial       Full        Yes        Yes                         +----------+------------+---------+-----------+----------+-------+  Radial         Full                                               +----------+------------+---------+-----------+----------+-------+  Ulnar          Full                                               +----------+------------+---------+-----------+----------+-------+  Cephalic       Full                                               +----------+------------+---------+-----------+----------+-------+  Basilic        Full        Yes        Yes                         +----------+------------+---------+-----------+----------+-------+  Summary:  Right: No evidence of thrombosis in the subclavian.  Left: No evidence of deep vein thrombosis in the upper extremity. No evidence of superficial vein thrombosis in the upper extremity.  *See table(s) above for measurements and observations.    Preliminary     Procedures Procedures    Medications Ordered in ED Medications - No data to display  ED Course/ Medical Decision Making/ A&P                           Medical Decision Making Amount and/or Complexity of Data Reviewed Labs: ordered. ECG/medicine tests:  ordered.   56 year old lady presenting to the emergency department with concern for chest pain.  Has history of CAD requiring stent placement.  On arrival here, no ongoing chest pain.  EKG without acute ischemic change, troponin x2 within normal limits.  Given these findings, I have low suspicion for ACS.  She has had some pain in her left arm over the past couple days and she did have some tenderness in her left upper arm.  No bony deformity noted.  DVT study was negative.  She has good distal pulses and sensation and motor in that arm.  Suspect may be MSK in nature.  On reassessment to discuss results with patient and husband, she again denies any ongoing pain.  Given reassuring work-up today, lack of ongoing symptoms, feel she can be discharged and managed in the outpatient setting and does not require admission.  I advised close follow-up with her primary cardiologist and her primary care doctor.  Independently reviewed CXR, no infiltrate or effusion or edema noted.  Additional history obtained from chart review, reviewed last office visit with cardiologist on 04/23/2020.  Pt sees [facility], Dr. Beau Fanny.        Final Clinical Impression(s) / ED Diagnoses Final diagnoses:  Chest pain, unspecified type  Pain of left upper extremity    Rx / DC Orders ED Discharge Orders     None         Lucrezia Starch, MD 05/07/21 2033

## 2021-05-07 NOTE — Progress Notes (Signed)
LUE venous duplex has been completed.  Results can be found under chart review under CV PROC. 05/07/2021 6:36 PM Carrington Olazabal RVT, RDMS

## 2021-05-16 ENCOUNTER — Ambulatory Visit: Payer: BC Managed Care – PPO | Admitting: Gastroenterology

## 2021-05-18 NOTE — Progress Notes (Deleted)
Referring Provider:  Primary Care Physician:  Rebekah Chesterfield, NP  Primary GI:   Patient Location: Home   Provider Location: [facility] office   Reason for Visit:    Persons present on the virtual encounter, with roles:    Total time (minutes) spent on medical discussion: ### minutes  Virtual Visit via Telephone Note Due to COVID-19, visit is conducted virtually and was requested by patient.   I connected withNAME@ on 05/18/21 at  4:00 PM EDT by telephone*** and verified that I am speaking with the correct person using two identifiers.   I discussed the limitations, risks, security and privacy concerns of performing an evaluation and management service by telephone*** and the availability of in person appointments. I also discussed with the patient that there may be a patient responsible charge related to this service. The patient expressed understanding and agreed to proceed.  No chief complaint on file.    History of Present Illness: [name] is a 56 year old female with chronic history of LUQ abdominal pain with some perceived bloating; there is a postprandial component as well.  She has been seen by multiple GI providers over the years including Dr. Teena Dunk, Dr. Randa Evens and [facility] in [place], and our practice.  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  She underwent EGD in August 2021 revealing a normal esophagus s/p dilation, prepyloric antral nodule which appeared to look different than a pancreatic rest but appeared to be benign though it did appear larger than previously described and larger than lesion photographed in 2013.  Biopsy showed reactive gastropathy with ulceration and reactive atypia, no H. pylori.  Additional evaluation of abdominal pain has included gastric emptying study in February 2017 with accelerated gastric emptying.  CTA 2017 negative  for mesenteric ischemia but noted upper abdominal ventral hernia s/p hernia repair with mesh.  Most recent CT A/P with contrast July 2021 with fatty liver, gallbladder surgically removed, stable nonobstructing left renal stone.     Also with history of chronic constipation and GERD.  Last colonoscopy in 2016.  Last seen in our office 03/21/2020.  At her visit prior to this,, it has been thought that her chronic LUQ abdominal pain is likely musculoskeletal/neuropathic and not emanating from the visceral GI tract.  She had recently been started on trazodone.  At the time of her visit in January, she reported trazodone did not help with LUQ pain.  Reported pain was worse depending on the way she sits or moves.  Could see her abdomen changing/moving in this region when it hurts.  Sometimes worse with meals.  No nausea or vomiting.  LUQ pain with bowel movements.  Couple weeks prior, she had bright red blood per rectum x1, moderate volume.  She is currently having 3 postprandial BMs daily, not requiring Linzess lately.  Having heartburn every morning.  Also having dysphagia with pills and some solid foods.  Plan to try lidocaine patch and continue trazodone, complete H. pylori stool antigen for completeness, and may need to consider referral to tertiary care for second opinion versus pain management referral.  Also increase omeprazole to 40 mg twice daily.  Recommended considering colonoscopy in the near future for evaluation of rectal bleeding when she was fully recovered from COVID. Also recommended checking LFTs at a later date for fatty liver and elevated AST.  We have not heard  from patient since her last office visit.  Today:     ?dicyclomine    Past Medical History:  Diagnosis Date   Diabetes mellitus    GERD (gastroesophageal reflux disease)    H/O hiatal hernia    High cholesterol    History of kidney stones    Hypertension    Hypothyroidism    MI (myocardial infarction) (HCC) 2015    Neuromuscular disorder (HCC)    neuropathy; carpal tunnel BOTH WRISTS     Past Surgical History:  Procedure Laterality Date   BIOPSY  10/10/2019   Procedure: BIOPSY;  Surgeon: Corbin Ade, MD;  Location: [facility];  Service: Endoscopy;;   CESAREAN SECTION     X 3   CHOLECYSTECTOMY     COLONOSCOPY  April 2016   Dr. Teena Dunk: diverticulum in ascending colon, small internal hemorrhoids,    COLONOSCOPY  2013   Dr. Randa Evens: normal mucosa, internal hemorrhoids. path: benign colonic mucosa   COLONOSCOPY WITH ESOPHAGOGASTRODUODENOSCOPY (EGD)     Dr. Randa Evens.    CORONARY ANGIOPLASTY WITH STENT PLACEMENT  2015   STENT X 1   ESOPHAGOGASTRODUODENOSCOPY N/A 04/11/2015   Dr. Outlaw:Gastric nodule    ESOPHAGOGASTRODUODENOSCOPY  2013   Dr. Randa Evens: normal esophagus. medium-sized submucosal mass with no bleeding and no stigmata of recent bleeding, s/p biopsy. path: benign small bowel mucosa, surgical polyp with polypoid reactive-type gastropathy, negative h.pylori    ESOPHAGOGASTRODUODENOSCOPY (EGD) WITH PROPOFOL N/A 10/10/2019   Rourk: Prepyloric antral nodule, appear to be larger than previously described and reports and larger than lesion photographed in 2013.  Biopsy showed reactive gastropathy with ulceration and reactive atypia, no H. pylori.  Esophageal dilation performed due to history of dysphagia.   EUS  11/18/2011   Dr. Dulce Sellar: gastric nodule, suspect benign leiomyoma or focal thicekning of muscularis propria with extension to the pyloric channel. Consider repeat EUS in 1-2 years.    EUS N/A 04/11/2015   Dr. Dulce Sellar: 10X45mm nodule, no significant change in size since last EUS Sept 2013. no further surveillance needed    INCISIONAL HERNIA REPAIR N/A 06/08/2015   Procedure: Sherald Hess HERNIORRHAPHY WITH MESH;  Surgeon: Franky Macho, MD;  Location: [facility];  Service: General;  Laterality: N/A;   INSERTION OF MESH N/A 06/08/2015   Procedure: INSERTION OF MESH;  Surgeon: Franky Macho, MD;  Location:  [facility];  Service: General;  Laterality: N/A;   MALONEY DILATION N/A 10/10/2019   Procedure: Elease Hashimoto DILATION;  Surgeon: Corbin Ade, MD;  Location: [facility];  Service: Endoscopy;  Laterality: N/A;   UTERINE ABLATION       No outpatient medications have been marked as taking for the 05/20/21 encounter (Appointment) with Letta Median, PA-C.     Family History  Problem Relation Age of Onset   Colon cancer Neg Hx     Social History   Socioeconomic History   Marital status: Married    Spouse name: Not on file   Number of children: Not on file   Years of education: Not on file   Highest education level: Not on file  Occupational History   Not on file  Tobacco Use   Smoking status: Every Day    Packs/day: 0.50    Types: Cigarettes   Smokeless tobacco: Never  Vaping Use   Vaping Use: Never used  Substance and Sexual Activity   Alcohol use: No    Alcohol/week: 0.0 standard drinks   Drug use: No   Sexual activity:  Not on file  Other Topics Concern   Not on file  Social History Narrative   Not on file   Social Determinants of Health   Financial Resource Strain: Not on file  Food Insecurity: Not on file  Transportation Needs: Not on file  Physical Activity: Not on file  Stress: Not on file  Social Connections: Not on file       Review of Systems: Gen: Denies fever, chills, anorexia. Denies fatigue, weakness, weight loss.  CV: Denies chest pain, palpitations, syncope, peripheral edema, and claudication. Resp: Denies dyspnea at rest, cough, wheezing, coughing up blood, and pleurisy. GI: see HPI Derm: Denies rash, itching, dry skin Psych: Denies depression, anxiety, memory loss, confusion. No homicidal or suicidal ideation.  Heme: Denies bruising, bleeding, and enlarged lymph nodes.  Observations/Objective: No distress. Alert and oriented. Pleasant. Well nourished. Normal mood and affect. Unable to perform complete physical exam due to telephone***  encounter. No video available.    Assessment and Plan:   Follow Up Instructions:    I discussed the assessment and treatment plan with the patient. The patient was provided an opportunity to ask questions and all were answered. The patient agreed with the plan and demonstrated an understanding of the instructions.   The patient was advised to call back or seek an in-person evaluation if the symptoms worsen or if the condition fails to improve as anticipated.  I provided *** minutes of non-face-to-face time during this encounter.  Ermalinda Memos, PA-C [facility]

## 2021-05-20 ENCOUNTER — Other Ambulatory Visit: Payer: Self-pay

## 2021-05-20 ENCOUNTER — Telehealth: Payer: Self-pay | Admitting: *Deleted

## 2021-05-20 ENCOUNTER — Telehealth: Payer: BC Managed Care – PPO | Admitting: Gastroenterology

## 2021-05-20 NOTE — Telephone Encounter (Signed)
Pt consented to a virtual visit. 

## 2021-05-20 NOTE — Telephone Encounter (Signed)
Basilio Cairo, you are scheduled for a virtual visit with your provider today.  Just as we do with appointments in the office, we must obtain your consent to participate.  Your consent will be active for this visit and any virtual visit you may have with one of our providers in the next 365 days.  If you have a MyChart account, I can also send a copy of this consent to you electronically.  All virtual visits are billed to your insurance company just like a traditional visit in the office.  As this is a virtual visit, video technology does not allow for your provider to perform a traditional examination.  This may limit your provider's ability to fully assess your condition.  If your provider identifies any concerns that need to be evaluated in person or the need to arrange testing such as labs, EKG, etc, we will make arrangements to do so.  Although advances in technology are sophisticated, we cannot ensure that it will always work on either your end or our end.  If the connection with a video visit is poor, we may have to switch to a telephone visit.  With either a video or telephone visit, we are not always able to ensure that we have a secure connection.   I need to obtain your verbal consent now.   Are you willing to proceed with your visit today?  ?

## 2023-07-30 ENCOUNTER — Ambulatory Visit (INDEPENDENT_AMBULATORY_CARE_PROVIDER_SITE_OTHER): Admitting: Orthopaedic Surgery

## 2023-07-30 ENCOUNTER — Other Ambulatory Visit: Payer: Self-pay

## 2023-07-30 ENCOUNTER — Encounter: Payer: Self-pay | Admitting: Orthopaedic Surgery

## 2023-07-30 DIAGNOSIS — G8929 Other chronic pain: Secondary | ICD-10-CM

## 2023-07-30 DIAGNOSIS — M25512 Pain in left shoulder: Secondary | ICD-10-CM

## 2023-07-30 NOTE — Progress Notes (Signed)
 Subjective:    Patient ID: [name], female    DOB: 1965-08-17, 58 y.o.   MRN: [id number]  HPI She has a six month or more pain in the left shoulder. She hurt her right arm at work in 2023 and has been using her left arm more and more.  She is right hand dominant.  She has pain in the left shoulder that does not radiate past the shoulder.  She has some neck pain that can radiate to the shoulder at times.  She has no swelling, no weakness, no redness, no numbness.  She is on Oxycodone  15 mgm four times a day because of a back problem.  She has taken ibuprofen also.  She was seen at the [facility] in the recent past.  She was told to use rubs and ibuprofen.  She has limited motion being difficult to move her hand over head.  She has pain sleeping on the left shoulder.  She has no trauma to the shoulder.  She is a diabetic. She takes Neurontin  also.  Review of Systems  Constitutional:  Positive for activity change.  Musculoskeletal:  Positive for arthralgias, back pain and myalgias.  All other systems reviewed and are negative. For Review of Systems, all other systems reviewed and are negative.  The following is a summary of the past history medically, past history surgically, known current medicines, social history and family history.  This information is gathered electronically by the computer from prior information and documentation.  I review this each visit and have found including this information at this point in the chart is beneficial and informative.   Past Medical History:  Diagnosis Date   Diabetes mellitus    GERD (gastroesophageal reflux disease)    H/O hiatal hernia    High cholesterol    History of kidney stones    Hypertension    Hypothyroidism    MI (myocardial infarction) (HCC) 2015   Neuromuscular disorder (HCC)    neuropathy; carpal tunnel BOTH WRISTS    Past Surgical History:  Procedure Laterality Date   BIOPSY  10/10/2019   Procedure:  BIOPSY;  Surgeon: Suzette Espy, MD;  Location: [facility];  Service: Endoscopy;;   CESAREAN SECTION     X 3   CHOLECYSTECTOMY     COLONOSCOPY  April 2016   Dr. Alline Ivans: diverticulum in ascending colon, small internal hemorrhoids,    COLONOSCOPY  2013   Dr. Denece Finger: normal mucosa, internal hemorrhoids. path: benign colonic mucosa   COLONOSCOPY WITH ESOPHAGOGASTRODUODENOSCOPY (EGD)     Dr. Denece Finger.    CORONARY ANGIOPLASTY WITH STENT PLACEMENT  2015   STENT X 1   ESOPHAGOGASTRODUODENOSCOPY N/A 04/11/2015   Dr. Outlaw:Gastric nodule    ESOPHAGOGASTRODUODENOSCOPY  2013   Dr. Denece Finger: normal esophagus. medium-sized submucosal mass with no bleeding and no stigmata of recent bleeding, s/p biopsy. path: benign small bowel mucosa, surgical polyp with polypoid reactive-type gastropathy, negative h.pylori    ESOPHAGOGASTRODUODENOSCOPY (EGD) WITH PROPOFOL  N/A 10/10/2019   Rourk: Prepyloric antral nodule, appear to be larger than previously described and reports and larger than lesion photographed in 2013.  Biopsy showed reactive gastropathy with ulceration and reactive atypia, no H. pylori.  Esophageal dilation performed due to history of dysphagia.   EUS  11/18/2011   Dr. Kimble Pennant: gastric nodule, suspect benign leiomyoma or focal thicekning of muscularis propria with extension to the pyloric channel. Consider repeat EUS in 1-2 years.    EUS N/A 04/11/2015  Dr. Kimble Pennant: 10X57mm nodule, no significant change in size since last EUS Sept 2013. no further surveillance needed    INCISIONAL HERNIA REPAIR N/A 06/08/2015   Procedure: Estrella Hench HERNIORRHAPHY WITH MESH;  Surgeon: Alanda Allegra, MD;  Location: [facility];  Service: General;  Laterality: N/A;   INSERTION OF MESH N/A 06/08/2015   Procedure: INSERTION OF MESH;  Surgeon: Alanda Allegra, MD;  Location: [facility];  Service: General;  Laterality: N/A;   MALONEY DILATION N/A 10/10/2019   Procedure: Londa Rival DILATION;  Surgeon: Suzette Espy, MD;  Location: [facility];  Service: Endoscopy;  Laterality: N/A;   UTERINE ABLATION      Current Outpatient Medications on File Prior to Visit  Medication Sig Dispense Refill   losartan  (COZAAR ) 50 MG tablet 1 tablet Orally Once a day for 30 day(s)     lubiprostone  (AMITIZA ) 24 MCG capsule 1 capsule with food and water  Orally Twice a day for 30 day(s)     ascorbic acid (VITAMIN C) 500 MG tablet Take 500 mg by mouth daily.     aspirin  EC 81 MG tablet Take 81 mg by mouth daily.     bumetanide (BUMEX) 1 MG tablet Take 1 mg by mouth daily. (Patient not taking: Reported on 07/30/2023)     dapagliflozin propanediol (FARXIGA) 10 MG TABS tablet Take 10 mg by mouth daily.     dicyclomine (BENTYL) 20 MG tablet Take 20 mg by mouth in the morning, at noon, and at bedtime.  (Patient not taking: Reported on 07/30/2023)     escitalopram (LEXAPRO) 10 MG tablet Take 20 mg by mouth daily. (Patient not taking: Reported on 07/30/2023)     gabapentin  (NEURONTIN ) 300 MG capsule Take 300 mg by mouth 2 (two) times daily.     glipiZIDE (GLUCOTROL XL) 10 MG 24 hr tablet Take 10 mg by mouth daily.     icosapent Ethyl (VASCEPA) 1 g capsule Take 2 g by mouth 2 (two) times daily. (Patient not taking: Reported on 07/30/2023)     insulin  degludec (TRESIBA) 100 UNIT/ML FlexTouch Pen Inject 40 Units into the skin daily. (Patient not taking: Reported on 07/30/2023)     levothyroxine  (SYNTHROID ) 75 MCG tablet Take 75 mcg by mouth daily before breakfast. (Patient not taking: Reported on 07/30/2023)     lidocaine  (LIDODERM ) 5 % PLACE 1 PATCH ONTO THE SKIN DAILY. REMOVE & DISCARD PATCH WITHIN 12 HOURS (Patient not taking: Reported on 07/30/2023) 30 patch 0   linaclotide (LINZESS) 290 MCG CAPS capsule Take 290 mcg by mouth as needed. (Patient not taking: Reported on 07/30/2023)     losartan -hydrochlorothiazide (HYZAAR) 50-12.5 MG tablet Take 1 tablet by mouth daily. (Patient not taking: Reported on 07/30/2023)     metFORMIN (GLUCOPHAGE) 1000 MG tablet Take  1,000 mg by mouth 2 (two) times daily with a meal.      nitroGLYCERIN  (NITROSTAT ) 0.4 MG SL tablet Place 0.4 mg under the tongue every 5 (five) minutes as needed for chest pain.      omeprazole  (PRILOSEC) 40 MG capsule Take 1 capsule (40 mg total) by mouth 2 (two) times daily with a meal. 180 capsule 1   oxyCODONE  (ROXICODONE ) 15 MG immediate release tablet Take 15 mg by mouth 4 (four) times daily as needed.     QUEtiapine (SEROQUEL) 25 MG tablet 1 tablet at bedtime Orally Once a day for 30 day(s)     traZODone (DESYREL) 100 MG tablet Take 100 mg by mouth at bedtime.  umeclidinium-vilanterol (ANORO ELLIPTA) 62.5-25 MCG/INH AEPB Inhale 1 puff into the lungs as needed.     vitamin B-12 (CYANOCOBALAMIN) 1000 MCG tablet Take 1,000 mcg by mouth daily.     No current facility-administered medications on file prior to visit.    Social History   Socioeconomic History   Marital status: Married    Spouse name: Not on file   Number of children: Not on file   Years of education: Not on file   Highest education level: Not on file  Occupational History   Not on file  Tobacco Use   Smoking status: Every Day    Current packs/day: 0.50    Types: Cigarettes   Smokeless tobacco: Never  Vaping Use   Vaping status: Never Used  Substance and Sexual Activity   Alcohol use: No    Alcohol/week: 0.0 standard drinks of alcohol   Drug use: No   Sexual activity: Not on file  Other Topics Concern   Not on file  Social History Narrative   Not on file   Social Drivers of Health   Financial Resource Strain: Not on file  Food Insecurity: Low Risk  (03/23/2023)   Received from [facility]   Hunger Vital Sign    Worried About Running Out of Food in the Last Year: Never true    Ran Out of Food in the Last Year: Never true  Transportation Needs: No Transportation Needs (03/23/2023)   Received from [facility]    In the past 12 months, has lack of reliable transportation kept you  from medical appointments, meetings, work or from getting things needed for daily living? : No  Physical Activity: Not on file  Stress: Not on file  Social Connections: Not on file  Intimate Partner Violence: Not on file    Family History  Problem Relation Age of Onset   Colon cancer Neg Hx     There were no vitals taken for this visit.  There is no height or weight on file to calculate BMI.      Objective:   Physical Exam Vitals and nursing note reviewed. Exam conducted with a chaperone present.  Constitutional:      Appearance: She is well-developed.  HENT:     Head: Normocephalic and atraumatic.  Eyes:     Conjunctiva/sclera: Conjunctivae normal.     Pupils: Pupils are equal, round, and reactive to light.  Cardiovascular:     Rate and Rhythm: Normal rate and regular rhythm.  Pulmonary:     Effort: Pulmonary effort is normal.  Abdominal:     Palpations: Abdomen is soft.  Musculoskeletal:       Arms:     Cervical back: Normal range of motion and neck supple.  Skin:    General: Skin is warm and dry.  Neurological:     Mental Status: She is alert and oriented to person, place, and time.     Cranial Nerves: No cranial nerve deficit.     Motor: No abnormal muscle tone.     Coordination: Coordination normal.     Deep Tendon Reflexes: Reflexes are normal and symmetric. Reflexes normal.  Psychiatric:        Behavior: Behavior normal.        Thought Content: Thought content normal.        Judgment: Judgment normal.    X-rays were done of the left shoulder, reported separately.  The humeral head is well located within the glenoid area.  There is degenerative changes of the [facility] joint.  No fracture or loose body noted.  Bone quality is good.  Apex of lung is clear     Assessment & Plan:   Encounter Diagnosis  Name Primary?   Chronic left shoulder pain Yes   PROCEDURE NOTE:  The patient request injection, verbal consent was obtained.  The left shoulder was  prepped appropriately after time out was performed.   Sterile technique was observed and injection of 1 cc of DepoMedrol 40mg  with several cc's of plain xylocaine . Anesthesia was provided by ethyl chloride and a 20-gauge needle was used to inject the shoulder area. A posterior approach was used.  The injection was tolerated well.  A band aid dressing was applied.  The patient was advised to apply ice later today and tomorrow to the injection sight as needed.  I will see her in one month.  She is planning to go to beach June 4, returning about a week later and then going to [place]  to see her grandchildren play ball in a tournament.  Call if any problem.  Precautions discussed.  Electronically Signed Pleasant Brilliant, MD 5/22/20258:49 AM

## 2023-08-27 ENCOUNTER — Encounter: Payer: Self-pay | Admitting: Orthopaedic Surgery

## 2023-08-27 ENCOUNTER — Ambulatory Visit: Admitting: Orthopaedic Surgery

## 2023-08-27 DIAGNOSIS — M25512 Pain in left shoulder: Secondary | ICD-10-CM | POA: Diagnosis not present

## 2023-08-27 DIAGNOSIS — G8929 Other chronic pain: Secondary | ICD-10-CM | POA: Diagnosis not present

## 2023-08-27 NOTE — Progress Notes (Signed)
 PROCEDURE NOTE:  The patient request injection, verbal consent was obtained.  The left shoulder was prepped appropriately after time out was performed.   Sterile technique was observed and injection of 1 cc of DepoMedrol 40mg  with several cc's of plain xylocaine . Anesthesia was provided by ethyl chloride and a 20-gauge needle was used to inject the shoulder area. A posterior approach was used.  The injection was tolerated well.  A band aid dressing was applied.  The patient was advised to apply ice later today and tomorrow to the injection sight as needed.  Encounter Diagnosis  Name Primary?   Chronic left shoulder pain Yes   Return in one month.  She is accompanied by her grandson who will be 7 on June 22.  Call if any problem.  Precautions discussed.  Electronically Signed Pleasant Brilliant, MD 6/19/20258:40 AM

## 2023-09-24 ENCOUNTER — Ambulatory Visit: Admitting: Orthopaedic Surgery
